# Patient Record
Sex: Female | Born: 1950 | Race: White | Hispanic: No | Smoking: Never smoker
Health system: Southern US, Community
[De-identification: ages and names within clinical notes are randomized; demographics above are authoritative.]

## PROBLEM LIST (undated history)

## (undated) DIAGNOSIS — M1990 Unspecified osteoarthritis, unspecified site: Secondary | ICD-10-CM

## (undated) DIAGNOSIS — L309 Dermatitis, unspecified: Secondary | ICD-10-CM

## (undated) DIAGNOSIS — R011 Cardiac murmur, unspecified: Secondary | ICD-10-CM

## (undated) DIAGNOSIS — K219 Gastro-esophageal reflux disease without esophagitis: Secondary | ICD-10-CM

## (undated) DIAGNOSIS — E7800 Pure hypercholesterolemia, unspecified: Secondary | ICD-10-CM

## (undated) DIAGNOSIS — F419 Anxiety disorder, unspecified: Secondary | ICD-10-CM

## (undated) DIAGNOSIS — I00 Rheumatic fever without heart involvement: Secondary | ICD-10-CM

## (undated) HISTORY — PX: DILATION AND CURETTAGE OF UTERUS: SHX78

## (undated) HISTORY — DX: Anxiety disorder, unspecified: F41.9

## (undated) HISTORY — PX: CATARACT EXTRACTION: SUR2

## (undated) HISTORY — PX: BLADDER REPAIR: SHX76

## (undated) HISTORY — DX: Dermatitis, unspecified: L30.9

---

## 2012-03-03 ENCOUNTER — Encounter (HOSPITAL_COMMUNITY): Payer: Self-pay | Admitting: Pharmacy Technician

## 2012-03-03 MED ORDER — SODIUM CHLORIDE 0.45 % IV SOLN: Freq: Once | INTRAVENOUS | Status: DC

## 2012-03-03 NOTE — H&P (Signed)
  NTS SOAP Note  Vital Signs:  Vitals as of: 01/01/2012: Systolic 127: Diastolic 71: Heart Rate 77: Temp 99.57F: Height 20ft 4in: Weight 142Lbs 0 Ounces: OFC 0in: Respiratory Rate 0: O2 Saturation 0: Pain Level 0: BMI 24  BMI : 24.37 kg/m2  Subjective: This 61 Years 72 Months old Female presents for of colon polyp.  Had a TCS in 2011 at another facility and told they removed a precancerous polyp.  They told her she needed follow up TCS in two years.  She also has some GERD and dysphagia.  Never has had an EGD.  Does have some bloating.  Review of Symptoms:  Constitutional:unremarkable Head:unremarkable Eyes:unremarkable sinus Cardiovascular:unremarkable Respiratory:unremarkable Gastrointestinabdominal pain,excessive gas Genitourinary:unremarkable Musculoskeletal:neck pain Skin:unremarkable Hematolgic/Lymphatic:unremarkable Allergic/Immunologic:unremarkable   Past Medical History:Reviewed   Past Medical History  Surgical History: TCS 2011, cataract surgery Medical Problems: unremarkable Allergies: nkda Medications: probiotics, cholestoff, calcium, glucosamine   Social History:Reviewed  Social History  Preferred Language: English ([place]) Race:  White Ethnicity: Not Hispanic / Latino Age: 61 Years 4 Months Marital Status:  W Alcohol:  No Recreational drug(s):  No   Smoking Status: Never smoker reviewed on 01/01/2012  Family History:Reviewed   Family History  Is there a family history of:No family h/o colon cancer             Father:  Cancer-prostate             Mother:  Coronary Artery Disease    Objective Information: General:Well appearing, well nourished in no distress. Heart:RRR, no murmur or gallop.  Normal S1, S2.  No S3, S4.  Lungs:CTA bilaterally, no wheezes, rhonchi, rales.  Breathing unlabored. Abdomen:Soft, NT/ND, no HSM, no masses. deferred to procedure  Assessment:h/o  colon polyps, dysphagia  Diagnosis &amp; Procedure: DiagnosisCode: V12.72, ProcedureCode: 81191, DiagnosisCode: 787.20, ProcedureCode: 47829,    Plan:Scheduled for TCS and EGD on 03/04/12.   Patient Education:Alternative treatments to surgery were discussed with patient (and family).Risks and benefits  of procedure were fully explained to the patient (and family) who gave informed consent. Patient/family questions were addressed.  Follow-up:Pending Surgery

## 2012-03-04 ENCOUNTER — Ambulatory Visit (HOSPITAL_COMMUNITY)
Admission: RE | Admit: 2012-03-04 | Discharge: 2012-03-04 | Disposition: A | Discharge status: Home / Self Care | Payer: BC Managed Care – PPO | Source: Ambulatory Visit | Attending: General Surgery | Admitting: General Surgery

## 2012-03-04 ENCOUNTER — Encounter (HOSPITAL_COMMUNITY)
Admission: RE | Disposition: A | Discharge status: Home / Self Care | Payer: Self-pay | Source: Ambulatory Visit | Attending: General Surgery

## 2012-03-04 ENCOUNTER — Encounter (HOSPITAL_COMMUNITY): Payer: Self-pay

## 2012-03-04 DIAGNOSIS — Z860100 Personal history of colon polyps, unspecified: Secondary | ICD-10-CM | POA: Insufficient documentation

## 2012-03-04 DIAGNOSIS — K219 Gastro-esophageal reflux disease without esophagitis: Secondary | ICD-10-CM | POA: Insufficient documentation

## 2012-03-04 DIAGNOSIS — Z09 Encounter for follow-up examination after completed treatment for conditions other than malignant neoplasm: Secondary | ICD-10-CM | POA: Insufficient documentation

## 2012-03-04 DIAGNOSIS — K449 Diaphragmatic hernia without obstruction or gangrene: Secondary | ICD-10-CM | POA: Insufficient documentation

## 2012-03-04 DIAGNOSIS — A048 Other specified bacterial intestinal infections: Secondary | ICD-10-CM | POA: Insufficient documentation

## 2012-03-04 HISTORY — PX: ESOPHAGOGASTRODUODENOSCOPY: SHX5428

## 2012-03-04 HISTORY — DX: Pure hypercholesterolemia, unspecified: E78.00

## 2012-03-04 HISTORY — PX: COLONOSCOPY: SHX5424

## 2012-03-04 HISTORY — DX: Unspecified osteoarthritis, unspecified site: M19.90

## 2012-03-04 HISTORY — DX: Cardiac murmur, unspecified: R01.1

## 2012-03-04 HISTORY — DX: Rheumatic fever without heart involvement: I00

## 2012-03-04 SURGERY — COLONOSCOPY
Anesthesia: Moderate Sedation

## 2012-03-04 MED ORDER — SODIUM CHLORIDE 0.9 % IV SOLN
1.0000 g | INTRAVENOUS | Status: DC
  Filled 2012-03-04: qty 1

## 2012-03-04 MED ORDER — SODIUM CHLORIDE 0.9 % IV SOLN
INTRAVENOUS | Status: AC
  Administered 2012-03-04: 1 g via INTRAVENOUS
  Filled 2012-03-04: qty 1

## 2012-03-04 MED ORDER — BUTAMBEN-TETRACAINE-BENZOCAINE 2-2-14 % EX AERO
INHALATION_SPRAY | CUTANEOUS | Status: DC | PRN
  Administered 2012-03-04: 2 via TOPICAL

## 2012-03-04 MED ORDER — MIDAZOLAM HCL 5 MG/5ML IJ SOLN
INTRAMUSCULAR | Status: DC | PRN
  Administered 2012-03-04: 1 mg via INTRAVENOUS
  Administered 2012-03-04: 3 mg via INTRAVENOUS

## 2012-03-04 MED ORDER — MEPERIDINE HCL 100 MG/ML IJ SOLN
INTRAMUSCULAR | Status: AC
  Filled 2012-03-04: qty 1

## 2012-03-04 MED ORDER — STERILE WATER FOR IRRIGATION IR SOLN
Status: DC | PRN
  Administered 2012-03-04: 08:00:00

## 2012-03-04 MED ORDER — MEPERIDINE HCL 25 MG/ML IJ SOLN
INTRAMUSCULAR | Status: DC | PRN
  Administered 2012-03-04: 50 mg via INTRAVENOUS

## 2012-03-04 MED ORDER — MIDAZOLAM HCL 5 MG/5ML IJ SOLN
INTRAMUSCULAR | Status: AC
  Filled 2012-03-04: qty 10

## 2012-03-04 NOTE — Interval H&P Note (Signed)
History and Physical Interval Note:  03/04/2012 8:15 AM  Cassie Gonzalez  has presented today for surgery, with the diagnosis of History of colon polyps   The various methods of treatment have been discussed with the patient and family. After consideration of risks, benefits and other options for treatment, the patient has consented to  Procedure(s) (LRB): COLONOSCOPY (N/A) ESOPHAGOGASTRODUODENOSCOPY (EGD) (N/A) as a surgical intervention .  The patient's history has been reviewed, patient examined, no change in status, stable for surgery.  I have reviewed the patients' chart and labs.  Questions were answered to the patient's satisfaction.     [name]

## 2012-03-04 NOTE — Op Note (Signed)
[facility] [address] [place], [place]  [address]  COLONOSCOPY PROCEDURE REPORT  PATIENT:  Cassie, Gonzalez  MR#:  [id number] BIRTHDATE:  Feb 18, 1951, 60 yrs. old  GENDER:  female ENDOSCOPIST:  Franky Macho, MD REF. BY: PROCEDURE DATE:  03/04/2012 PROCEDURE:  Colonoscopy 19147 ASA CLASS:  Class II INDICATIONS:  follow-up of polyp MEDICATIONS:   Versed 4 mg IV, demerol 50 mg IV  DESCRIPTION OF PROCEDURE:   After the risks benefits and alternatives of the procedure were thoroughly explained, informed consent was obtained.  Digital rectal exam was performed and revealed no abnormalities.   The EC-[id number] ([id number]) endoscope was introduced through the anus and advanced to the cecum, which was identified by both the appendix and ileocecal valve, without limitations.  The quality of the prep was adequate..  The instrument was then slowly withdrawn as the colon was fully examined.  FINDINGS:  A normal appearing cecum, ileocecal valve, and appendiceal orifice were identified. The ascending, hepatic flexure, transverse, splenic flexure, descending, sigmoid colon, and rectum appeared unremarkable.   Retroflexed views in the rectum revealed it was not tolerated by the patient.     The scope was then withdrawn from the cecum and the procedure completed. COMPLICATIONS:  None ENDOSCOPIC IMPRESSION: 1) Normal colon  RECOMMENDATIONS:  REPEAT EXAM:  In 5 year(s) for Colonoscopy.  ______________________________ Franky Macho, MD  CC:  n. eSIGNEDFranky Macho at 03/04/2012 08:43 AM  Royanne Foots, [phone]

## 2012-03-04 NOTE — Discharge Instructions (Signed)
Esophagogastroduodenoscopy This is an endoscopic procedure (a procedure that uses a device like a flexible telescope) that allows your caregiver to view the upper stomach and small bowel. This test allows your caregiver to look at the esophagus. The esophagus carries food from your mouth to your stomach. They can also look at your duodenum. This is the first part of the small intestine that attaches to the stomach. This test is used to detect problems in the bowel such as ulcers and inflammation. PREPARATION FOR TEST Nothing to eat after midnight the day before the test. NORMAL FINDINGS Normal esophagus, stomach, and duodenum. Ranges for normal findings may vary among different laboratories and hospitals. You should always check with your doctor after having lab work or other tests done to discuss the meaning of your test results and whether your values are considered within normal limits. MEANING OF TEST  Your caregiver will go over the test results with you and discuss the importance and meaning of your results, as well as treatment options and the need for additional tests if necessary. OBTAINING THE TEST RESULTS It is your responsibility to obtain your test results. Ask the lab or department performing the test when and how you will get your results. Document Released: 01/04/2005 Document Revised: 08/23/2011 Document Reviewed: 08/13/2008 ExitCare Patient Information 2012 [employer].Colonoscopy Care After Read the instructions outlined below and refer to this sheet in the next few weeks. These discharge instructions provide you with general information on caring for yourself after you leave the hospital. Your doctor may also give you specific instructions. While your treatment has been planned according to the most current medical practices available, unavoidable complications occasionally occur. If you have any problems or questions after discharge, call your doctor. HOME CARE  INSTRUCTIONS ACTIVITY:  You may resume your regular activity, but move at a slower pace for the next 24 hours.   Take frequent rest periods for the next 24 hours.   Walking will help get rid of the air and reduce the bloated feeling in your belly (abdomen).   No driving for 24 hours (because of the medicine (anesthesia) used during the test).   You may shower.   Do not sign any important legal documents or operate any machinery for 24 hours (because of the anesthesia used during the test).  NUTRITION:  Drink plenty of fluids.   You may resume your normal diet as instructed by your doctor.   Begin with a light meal and progress to your normal diet. Heavy or fried foods are harder to digest and may make you feel sick to your stomach (nauseated).   Avoid alcoholic beverages for 24 hours or as instructed.  MEDICATIONS:  You may resume your normal medications unless your doctor tells you otherwise.  WHAT TO EXPECT TODAY:  Some feelings of bloating in the abdomen.   Passage of more gas than usual.   Spotting of blood in your stool or on the toilet paper.  IF YOU HAD POLYPS REMOVED DURING THE COLONOSCOPY:  No aspirin products for 7 days or as instructed.   No alcohol for 7 days or as instructed.   Eat a soft diet for the next 24 hours.  FINDING OUT THE RESULTS OF YOUR TEST Not all test results are available during your visit. If your test results are not back during the visit, make an appointment with your caregiver to find out the results. Do not assume everything is normal if you have not heard from your caregiver   or the medical facility. It is important for you to follow up on all of your test results.  SEEK IMMEDIATE MEDICAL CARE IF:  You have more than a spotting of blood in your stool.   Your belly is swollen (abdominal distention).   You are nauseated or vomiting.   You have a fever.   You have abdominal pain or discomfort that is severe or gets worse throughout  the day.  Document Released: 04/17/2004 Document Revised: 08/23/2011 Document Reviewed: 04/15/2008 ExitCare Patient Information 2012 [employer]. 

## 2012-03-04 NOTE — Op Note (Signed)
[facility] [address] [place], [place]  [address]  ENDOSCOPY PROCEDURE REPORT  PATIENT:  Cassie Gonzalez, Cassie Gonzalez  MR#:  [id number] BIRTHDATE:  20-Jan-1951, 60 yrs. old  GENDER:  female  ENDOSCOPIST:  Franky Macho, MD Referred by:  PROCEDURE DATE:  03/04/2012 PROCEDURE:  EGD with biopsy for H. pylori 19147 ASA CLASS:  Class II INDICATIONS:  GERD  MEDICATIONS:   Versed 4 mg IV, demerol 50 mg IV TOPICAL ANESTHETIC:  Cetacaine Spray  DESCRIPTION OF PROCEDURE:   After the risks benefits and alternatives of the procedure were thoroughly explained, informed consent was obtained.  The [id number] ([id number]) and EC-[id number] ([id number]) endoscope was introduced through the mouth and advanced to the second portion of the duodenum, without limitations.  The instrument was slowly withdrawn as the mucosa was fully examined. <<PROCEDUREIMAGES>>  The duodenal bulb was normal in appearance, as was the postbulbar duodenum.  The stomach was entered and closely examined. The antrum, angularis, and lesser curvature were well visualized, including a retroflexed view of the cardia and fundus. The stomach wall was normally distensable. The scope passed easily through the pylorus into the duodenum.  A hiatal hernia was found (see image001 and image002).  Z-line at 34cm from the teeth.  GE juncture at 36cm from the teeth.  Esophagus otherwise within normal limits.  No eosphagitis noted.  The scope was then withdrawn from the patient and the procedure completed.  COMPLICATIONS:  None  ENDOSCOPIC IMPRESSION: 1) Normal duodenum 2) Normal stomach 3) Hiatal hernia  RECOMMENDATIONS: 1) follow-up of helicobacter pylori status, treat if indicated  REPEAT EXAM:  No  ______________________________ Franky Macho, MD  CC:  n. eSIGNEDFranky Macho at 03/04/2012 08:49 AM  Royanne Foots, [phone]

## 2012-03-05 ENCOUNTER — Encounter (HOSPITAL_COMMUNITY): Payer: Self-pay | Admitting: General Surgery

## 2016-11-29 ENCOUNTER — Ambulatory Visit: Payer: Self-pay | Admitting: Surgery

## 2016-11-29 NOTE — Patient Instructions (Signed)
Cassie Gonzalez  11/29/2016   Your procedure is scheduled on: 12/04/2016    [facility]  [place]tr[place]  elevators to 3rd floor to  Short Stay Center at   0915 AM.  Call this number if you have problems the morning of surgery [phone]   Remember: ONLY 1 PERSON MAY GO WITH YOU TO SHORT STAY TO GET  READY MORNING OF YOUR SURGERY.  Do not eat food or drink liquids :After Midnight.     Take these medicines the morning of surgery with A SIP OF WATER: Eye drops if needed, Prilosec                                 You may not have any metal on your body including hair pins and              piercings  Do not wear jewelry, make-up, lotions, powders or perfumes, deodorant             Do not wear nail polish.  Do not shave  48 hours prior to surgery.                 Do not bring valuables to the hospital. [facility] IS NOT             RESPONSIBLE   FOR VALUABLES.  Contacts, dentures or bridgework may not be worn into surgery.      Patients discharged the day of surgery will not be allowed to drive home.  Name and phone number of your driver:                Please read over the following fact sheets you were given: _____________________________________________________________________             [facility] - Pre[facility] Before surgery, you can play an important role.  Because skin is not sterile, your skin needs to be as free of germs as possible.  You can reduce the number of germs on your skin by washing with CHG (chlorahexidine gluconate) soap before surgery.  CHG is an antiseptic cleaner which kills germs and bonds with the skin to continue killing germs even after washing. Please DO NOT use if you have an allergy to CHG or antibacterial soaps.  If your skin becomes reddened/irritated stop using the CHG and inform your nurse when you arrive at Short Stay. Do not shave (including legs and underarms) for at least 48 hours prior to  the first CHG shower.  You may shave your face/neck. Please follow these instructions carefully:  1.  Shower with CHG Soap the night before surgery and the  morning of Surgery.  2.  If you choose to wash your hair, wash your hair first as usual with your  normal  shampoo.  3.  After you shampoo, rinse your hair and body thoroughly to remove the  shampoo.                           4.  Use CHG as you would any other liquid soap.  You can apply chg directly  to the skin and wash                       Gently  with a scrungie or clean washcloth.  5.  Apply the CHG Soap to your body ONLY FROM THE NECK DOWN.   Do not use on face/ open                           Wound or open sores. Avoid contact with eyes, ears mouth and genitals (private parts).                       Wash face,  Genitals (private parts) with your normal soap.             6.  Wash thoroughly, paying special attention to the area where your surgery  will be performed.  7.  Thoroughly rinse your body with warm water from the neck down.  8.  DO NOT shower/wash with your normal soap after using and rinsing off  the CHG Soap.                9.  Pat yourself dry with a clean towel.            10.  Wear clean pajamas.            11.  Place clean sheets on your bed the night of your first shower and do not  sleep with pets. Day of Surgery : Do not apply any lotions/deodorants the morning of surgery.  Please wear clean clothes to the hospital/surgery center.  FAILURE TO FOLLOW THESE INSTRUCTIONS MAY RESULT IN THE CANCELLATION OF YOUR SURGERY PATIENT SIGNATURE_________________________________  NURSE SIGNATURE__________________________________  ________________________________________________________________________

## 2016-11-29 NOTE — H&P (Signed)
Cassie Gonzalez 11/29/2016 10:57 AM Location: [facility] Patient #: [id number] DOB: 23-Aug-1951 Widowed / Language: [place] / Race: White Female  History of Present Illness (Zale Marcotte A. Fredricka Bonineonnor MD; 11/29/2016 11:26 AM) Patient words: This is a very nice 66 year old woman who is referred for biliary dyskinesia. She has been having symptoms for at least 2 months. She experiences epigastric discomfort which radiates around both sides into her back. The pain is similar to gas pain. It is exacerbated by eating. She does not have any nausea or emesis, but she does have a history of GERD which is well controlled with medication. She has a known small hiatal hernia. She also has a history of chronic constipation followed by GI. She underwent upper endoscopy, colonoscopy, EKG, echocardiogram, treadmill stress test, right upper quadrant ultrasound as part of her workup-all of these were unremarkable. She had labs done in December which as she recalls were normal. She finally underwent a HIDA scan last week that showed a gallbladder ejection fraction of 20%.  She is never had any abdominal surgery. She does have a history of heart murmur and rheumatic fever as a child but again her cardiac workup as above was normal.  The patient is a 66 year old female.   Past Surgical History Juanita Craver(Armen Glenn, CMA; 11/29/2016 10:57 AM) Cataract Surgery Bilateral. Colon Polyp Removal - Colonoscopy  Diagnostic Studies History Juanita Craver(Armen Glenn, CMA; 11/29/2016 10:57 AM) Colonoscopy within last year Mammogram within last year  Allergies Juanita Craver(Armen Glenn, CMA; 11/29/2016 10:59 AM) Sulfa Antibiotics  Medication History (Armen Sherrine MaplesGlenn, CMA; 11/29/2016 11:04 AM) Omeprazole (20MG  Capsule DR, Oral) Active. Simvastatin (10MG  Tablet, Oral) Active. Latanoprost (0.005% Solution, Ophthalmic) Active. Vitamin B12 (1000MCG Tablet ER, Oral) Active. Calcium (1200-1000MG -UNIT Tablet Chewable, Oral) Active. Vitamin D3  (1000UNIT Tablet, Oral) Active. Vitamin C (500MG  Tablet, Oral) Active. Zantac (150MG  Tablet, Oral) Active. Gas-X (80MG  Tablet Chewable, Oral) Active. Align (4MG  Capsule, Oral) Active. MiraLax (Oral) Active. Estrace (1MG  Tablet, Oral) Active. Fluzone High-Dose (0.5ML Susp Pref Syr, Intramuscular) Active. Medications Reconciled  Social History Juanita Craver(Armen Glenn, CMA; 11/29/2016 10:57 AM) Caffeine use Coffee, Tea. No alcohol use Tobacco use Never smoker.  Family History Juanita Craver(Armen Glenn, CMA; 11/29/2016 10:57 AM) Arthritis Mother. Breast Cancer Family Members In General. Colon Cancer Family Members In General. Heart Disease Mother. Heart disease in female family member before age 265 Prostate Cancer Father.  Pregnancy / Birth History Juanita Craver(Armen Glenn, CMA; 11/29/2016 10:57 AM) Age at menarche 9 years. Age of menopause 46>60 Gravida 2 Maternal age 38-25  Other Problems Juanita Craver(Armen Glenn, CMA; 11/29/2016 10:57 AM) Arthritis Bladder Problems Gastroesophageal Reflux Disease Heart murmur     Review of Systems (Armen Glenn CMA; 11/29/2016 10:57 AM) General Present- Appetite Loss, Fatigue, Night Sweats and Weight Loss. Not Present- Chills, Fever and Weight Gain. Skin Not Present- Change in Wart/Mole, Dryness, Hives, Jaundice, New Lesions, Non-Healing Wounds, Rash and Ulcer. HEENT Present- Ringing in the Ears, Seasonal Allergies, Sinus Pain and Wears glasses/contact lenses. Not Present- Earache, Hearing Loss, Hoarseness, Nose Bleed, Oral Ulcers, Sore Throat, Visual Disturbances and Yellow Eyes. Respiratory Present- Snoring. Not Present- Bloody sputum, Chronic Cough, Difficulty Breathing and Wheezing. Breast Not Present- Breast Mass, Breast Pain, Nipple Discharge and Skin Changes. Gastrointestinal Present- Bloating, Constipation and Excessive gas. Not Present- Abdominal Pain, Bloody Stool, Change in Bowel Habits, Chronic diarrhea, Difficulty Swallowing, Gets full quickly at meals,  Hemorrhoids, Indigestion, Nausea, Rectal Pain and Vomiting. Female Genitourinary Present- Nocturia. Not Present- Frequency, Painful Urination, Pelvic Pain and Urgency. Musculoskeletal Present- Back Pain  and Joint Stiffness. Not Present- Joint Pain, Muscle Pain, Muscle Weakness and Swelling of Extremities. Neurological Present- Weakness. Not Present- Decreased Memory, Fainting, Headaches, Numbness, Seizures, Tingling, Tremor and Trouble walking. Psychiatric Not Present- Anxiety, Bipolar, Change in Sleep Pattern, Depression, Fearful and Frequent crying. Endocrine Present- Hot flashes. Not Present- Cold Intolerance, Excessive Hunger, Hair Changes, Heat Intolerance and New Diabetes. Hematology Present- Easy Bruising. Not Present- Blood Thinners, Excessive bleeding, Gland problems, HIV and Persistent Infections.  Vitals (Armen Glenn CMA; 11/29/2016 10:58 AM) 11/29/2016 10:57 AM Weight: 127 lb Height: 64in Body Surface Area: 1.61 m Body Mass Index: 21.8 kg/m  Temp.: 97.54F  Pulse: 64 (Regular)  BP: 128/68 (Sitting, Left Arm, Standard)      Physical Exam (Falicity Sheets A. Fredricka Bonine MD; 11/29/2016 11:27 AM)  General Note: She is alert and oriented, no distress  Integumentary Note: No lesions or rashes on limited skin exam  Head and Neck Note: No mass or thyromegaly  Eye Note: Anicteric, extraocular motion intact  ENMT Note: Moist mucous membranes, dentures in place  Chest and Lung Exam Note: Unlabored respirations, symmetrical air entry  Cardiovascular Note: Regular rate and rhythm, no pedal edema  Abdomen Note: Soft, nontender nondistended. No mass or organomegaly  Neurologic Note: Grossly intact, normal gait  Neuropsychiatric Note: Normal mood and affect, appropriate insight  Musculoskeletal Note: Strength symmetrical throughout, no deformity    Assessment & Plan (Bartt Gonzaga A. Bathsheba Durrett MD; 11/29/2016 11:29 AM)  BILIARY DYSKINESIA (K82.8) Story: Symptomatic  with pain and about a 10 pound weight loss since January. She is interested in gallbladder surgery. We discussed the nature laparoscopic gallbladder surgery, the expected postoperative recovery, and the risks of bleeding, pain, infection, scarring, common bile duct injury or other ventral abdominal injury and sequelae, conversion to open surgery. She had several appropriate questions all of which were answered. At the end of our conversation she desries lap chole. We will get her scheduled for laparoscopic cholecystectomy as soon as possible.

## 2016-11-30 ENCOUNTER — Encounter (HOSPITAL_COMMUNITY)
Admission: RE | Admit: 2016-11-30 | Discharge: 2016-11-30 | Disposition: A | Discharge status: Home / Self Care | Payer: Medicare Other | Source: Ambulatory Visit | Attending: Surgery | Admitting: Surgery

## 2016-11-30 ENCOUNTER — Ambulatory Visit (HOSPITAL_COMMUNITY)
Admission: RE | Admit: 2016-11-30 | Discharge: 2016-11-30 | Disposition: A | Discharge status: Home / Self Care | Payer: Medicare Other | Source: Ambulatory Visit | Attending: Surgery | Admitting: Surgery

## 2016-11-30 ENCOUNTER — Encounter (INDEPENDENT_AMBULATORY_CARE_PROVIDER_SITE_OTHER): Payer: Self-pay

## 2016-11-30 ENCOUNTER — Encounter (HOSPITAL_COMMUNITY): Payer: Self-pay

## 2016-11-30 DIAGNOSIS — K828 Other specified diseases of gallbladder: Secondary | ICD-10-CM | POA: Insufficient documentation

## 2016-11-30 DIAGNOSIS — Z01812 Encounter for preprocedural laboratory examination: Secondary | ICD-10-CM | POA: Insufficient documentation

## 2016-11-30 DIAGNOSIS — Z01810 Encounter for preprocedural cardiovascular examination: Secondary | ICD-10-CM

## 2016-11-30 DIAGNOSIS — Z01818 Encounter for other preprocedural examination: Secondary | ICD-10-CM | POA: Insufficient documentation

## 2016-11-30 LAB — CBC
HCT: 38 % (ref 36.0–46.0)
Hemoglobin: 12.9 g/dL (ref 12.0–15.0)
MCH: 29.2 pg (ref 26.0–34.0)
MCHC: 33.9 g/dL (ref 30.0–36.0)
MCV: 86 fL (ref 78.0–100.0)
PLATELETS: 260 10*3/uL (ref 150–400)
RBC: 4.42 MIL/uL (ref 3.87–5.11)
RDW: 13.2 % (ref 11.5–15.5)
WBC: 6.1 10*3/uL (ref 4.0–10.5)

## 2016-11-30 LAB — COMPREHENSIVE METABOLIC PANEL
ALT: 15 U/L (ref 14–54)
AST: 22 U/L (ref 15–41)
Albumin: 4.4 g/dL (ref 3.5–5.0)
Alkaline Phosphatase: 60 U/L (ref 38–126)
Anion gap: 6 (ref 5–15)
BUN: 17 mg/dL (ref 6–20)
CHLORIDE: 109 mmol/L (ref 101–111)
CO2: 26 mmol/L (ref 22–32)
CREATININE: 0.8 mg/dL (ref 0.44–1.00)
Calcium: 9.9 mg/dL (ref 8.9–10.3)
Glucose, Bld: 94 mg/dL (ref 65–99)
POTASSIUM: 3.9 mmol/L (ref 3.5–5.1)
Sodium: 141 mmol/L (ref 135–145)
Total Bilirubin: 0.7 mg/dL (ref 0.3–1.2)
Total Protein: 7.4 g/dL (ref 6.5–8.1)

## 2016-11-30 LAB — APTT: APTT: 33 s (ref 24–36)

## 2016-11-30 LAB — PROTIME-INR
INR: 1.04
PROTHROMBIN TIME: 13.6 s (ref 11.4–15.2)

## 2016-11-30 NOTE — Progress Notes (Signed)
LOV cardio Dr Sherlie Banruitte 10-05-16 on chart EKG 10-05-16 on chart  Stress test 10-26-16 on chart ECHO 10-26-16 on hcart

## 2016-12-04 ENCOUNTER — Ambulatory Visit (HOSPITAL_COMMUNITY): Payer: Medicare Other | Admitting: Anesthesiology

## 2016-12-04 ENCOUNTER — Ambulatory Visit (HOSPITAL_COMMUNITY)
Admission: RE | Admit: 2016-12-04 | Discharge: 2016-12-04 | Disposition: A | Discharge status: Home / Self Care | Payer: Medicare Other | Source: Ambulatory Visit | Attending: Surgery | Admitting: Surgery

## 2016-12-04 ENCOUNTER — Encounter (HOSPITAL_COMMUNITY)
Admission: RE | Disposition: A | Discharge status: Home / Self Care | Payer: Self-pay | Source: Ambulatory Visit | Attending: Surgery

## 2016-12-04 ENCOUNTER — Encounter (HOSPITAL_COMMUNITY): Payer: Self-pay

## 2016-12-04 DIAGNOSIS — K811 Chronic cholecystitis: Secondary | ICD-10-CM | POA: Insufficient documentation

## 2016-12-04 DIAGNOSIS — K219 Gastro-esophageal reflux disease without esophagitis: Secondary | ICD-10-CM | POA: Diagnosis not present

## 2016-12-04 DIAGNOSIS — Z8042 Family history of malignant neoplasm of prostate: Secondary | ICD-10-CM | POA: Insufficient documentation

## 2016-12-04 DIAGNOSIS — Z9841 Cataract extraction status, right eye: Secondary | ICD-10-CM | POA: Insufficient documentation

## 2016-12-04 DIAGNOSIS — Z79890 Hormone replacement therapy (postmenopausal): Secondary | ICD-10-CM | POA: Diagnosis not present

## 2016-12-04 DIAGNOSIS — M1990 Unspecified osteoarthritis, unspecified site: Secondary | ICD-10-CM | POA: Diagnosis not present

## 2016-12-04 DIAGNOSIS — Z8249 Family history of ischemic heart disease and other diseases of the circulatory system: Secondary | ICD-10-CM | POA: Diagnosis not present

## 2016-12-04 DIAGNOSIS — Z803 Family history of malignant neoplasm of breast: Secondary | ICD-10-CM | POA: Insufficient documentation

## 2016-12-04 DIAGNOSIS — Z79899 Other long term (current) drug therapy: Secondary | ICD-10-CM | POA: Diagnosis not present

## 2016-12-04 DIAGNOSIS — Z800 Family history of malignant neoplasm of digestive organs: Secondary | ICD-10-CM | POA: Insufficient documentation

## 2016-12-04 DIAGNOSIS — Z9842 Cataract extraction status, left eye: Secondary | ICD-10-CM | POA: Insufficient documentation

## 2016-12-04 DIAGNOSIS — K828 Other specified diseases of gallbladder: Secondary | ICD-10-CM | POA: Diagnosis present

## 2016-12-04 DIAGNOSIS — Z882 Allergy status to sulfonamides status: Secondary | ICD-10-CM | POA: Insufficient documentation

## 2016-12-04 DIAGNOSIS — Z86010 Personal history of colonic polyps: Secondary | ICD-10-CM | POA: Insufficient documentation

## 2016-12-04 HISTORY — PX: CHOLECYSTECTOMY: SHX55

## 2016-12-04 SURGERY — LAPAROSCOPIC CHOLECYSTECTOMY
Anesthesia: General | Site: Abdomen

## 2016-12-04 MED ORDER — 0.9 % SODIUM CHLORIDE (POUR BTL) OPTIME
TOPICAL | Status: DC | PRN
  Administered 2016-12-04: 1000 mL

## 2016-12-04 MED ORDER — ONDANSETRON HCL 4 MG/2ML IJ SOLN
INTRAMUSCULAR | Status: DC | PRN
  Administered 2016-12-04: 4 mg via INTRAVENOUS

## 2016-12-04 MED ORDER — FENTANYL CITRATE (PF) 100 MCG/2ML IJ SOLN
INTRAMUSCULAR | Status: AC
  Filled 2016-12-04: qty 2

## 2016-12-04 MED ORDER — LACTATED RINGERS IR SOLN
Status: DC | PRN
  Administered 2016-12-04: 1000 mL

## 2016-12-04 MED ORDER — FENTANYL CITRATE (PF) 100 MCG/2ML IJ SOLN: 25.0000 ug | INTRAMUSCULAR | Status: DC | PRN

## 2016-12-04 MED ORDER — HYDROCODONE-ACETAMINOPHEN 5-325 MG PO TABS: 1.0000 | ORAL_TABLET | Freq: Four times a day (QID) | ORAL | 0 refills | Status: DC | PRN

## 2016-12-04 MED ORDER — LIDOCAINE 2% (20 MG/ML) 5 ML SYRINGE
INTRAMUSCULAR | Status: AC
  Filled 2016-12-04: qty 5

## 2016-12-04 MED ORDER — DEXAMETHASONE SODIUM PHOSPHATE 10 MG/ML IJ SOLN
INTRAMUSCULAR | Status: DC | PRN
  Administered 2016-12-04: 10 mg via INTRAVENOUS

## 2016-12-04 MED ORDER — BUPIVACAINE-EPINEPHRINE 0.25% -1:200000 IJ SOLN
INTRAMUSCULAR | Status: AC
  Filled 2016-12-04: qty 1

## 2016-12-04 MED ORDER — SUCCINYLCHOLINE CHLORIDE 200 MG/10ML IV SOSY
PREFILLED_SYRINGE | INTRAVENOUS | Status: DC | PRN
  Administered 2016-12-04: 100 mg via INTRAVENOUS

## 2016-12-04 MED ORDER — SUGAMMADEX SODIUM 200 MG/2ML IV SOLN
INTRAVENOUS | Status: DC | PRN
  Administered 2016-12-04: 150 mg via INTRAVENOUS

## 2016-12-04 MED ORDER — GABAPENTIN 300 MG PO CAPS
300.0000 mg | ORAL_CAPSULE | ORAL | Status: AC
  Administered 2016-12-04: 300 mg via ORAL
  Filled 2016-12-04: qty 1

## 2016-12-04 MED ORDER — HYDROMORPHONE HCL 1 MG/ML IJ SOLN
0.2500 mg | INTRAMUSCULAR | Status: DC | PRN
  Administered 2016-12-04 (×2): 0.25 mg via INTRAVENOUS

## 2016-12-04 MED ORDER — ACETAMINOPHEN 325 MG PO TABS: 650.0000 mg | ORAL_TABLET | ORAL | Status: DC | PRN

## 2016-12-04 MED ORDER — HYDROMORPHONE HCL 2 MG/ML IJ SOLN
INTRAMUSCULAR | Status: AC
  Filled 2016-12-04: qty 1

## 2016-12-04 MED ORDER — LACTATED RINGERS IV SOLN
INTRAVENOUS | Status: DC
  Administered 2016-12-04 (×2): via INTRAVENOUS

## 2016-12-04 MED ORDER — ACETAMINOPHEN 650 MG RE SUPP
650.0000 mg | RECTAL | Status: DC | PRN
  Filled 2016-12-04: qty 1

## 2016-12-04 MED ORDER — MIDAZOLAM HCL 5 MG/5ML IJ SOLN
INTRAMUSCULAR | Status: DC | PRN
  Administered 2016-12-04: 2 mg via INTRAVENOUS

## 2016-12-04 MED ORDER — MEPERIDINE HCL 50 MG/ML IJ SOLN: 6.2500 mg | INTRAMUSCULAR | Status: DC | PRN

## 2016-12-04 MED ORDER — CEFAZOLIN SODIUM-DEXTROSE 2-4 GM/100ML-% IV SOLN
2.0000 g | INTRAVENOUS | Status: AC
  Administered 2016-12-04: 2 g via INTRAVENOUS
  Filled 2016-12-04: qty 100

## 2016-12-04 MED ORDER — PROMETHAZINE HCL 25 MG/ML IJ SOLN: 6.2500 mg | INTRAMUSCULAR | Status: DC | PRN

## 2016-12-04 MED ORDER — SODIUM CHLORIDE 0.9% FLUSH: 3.0000 mL | INTRAVENOUS | Status: DC | PRN

## 2016-12-04 MED ORDER — SODIUM CHLORIDE 0.9 % IV SOLN: 250.0000 mL | INTRAVENOUS | Status: DC | PRN

## 2016-12-04 MED ORDER — SODIUM CHLORIDE 0.9% FLUSH: 3.0000 mL | Freq: Two times a day (BID) | INTRAVENOUS | Status: DC

## 2016-12-04 MED ORDER — CHLORHEXIDINE GLUCONATE 4 % EX LIQD: 60.0000 mL | Freq: Once | CUTANEOUS | Status: DC

## 2016-12-04 MED ORDER — SUGAMMADEX SODIUM 200 MG/2ML IV SOLN
INTRAVENOUS | Status: AC
  Filled 2016-12-04: qty 2

## 2016-12-04 MED ORDER — FENTANYL CITRATE (PF) 100 MCG/2ML IJ SOLN
INTRAMUSCULAR | Status: DC | PRN
  Administered 2016-12-04 (×2): 50 ug via INTRAVENOUS

## 2016-12-04 MED ORDER — DEXAMETHASONE SODIUM PHOSPHATE 10 MG/ML IJ SOLN
INTRAMUSCULAR | Status: AC
  Filled 2016-12-04: qty 1

## 2016-12-04 MED ORDER — CELECOXIB 200 MG PO CAPS: 400.0000 mg | ORAL_CAPSULE | ORAL | Status: DC

## 2016-12-04 MED ORDER — OXYCODONE HCL 5 MG PO TABS
5.0000 mg | ORAL_TABLET | ORAL | Status: DC | PRN
  Administered 2016-12-04: 5 mg via ORAL
  Filled 2016-12-04: qty 1

## 2016-12-04 MED ORDER — ROCURONIUM BROMIDE 10 MG/ML (PF) SYRINGE
PREFILLED_SYRINGE | INTRAVENOUS | Status: DC | PRN
  Administered 2016-12-04: 30 mg via INTRAVENOUS

## 2016-12-04 MED ORDER — ACETAMINOPHEN 500 MG PO TABS
1000.0000 mg | ORAL_TABLET | ORAL | Status: AC
  Administered 2016-12-04: 1000 mg via ORAL
  Filled 2016-12-04: qty 2

## 2016-12-04 MED ORDER — BUPIVACAINE-EPINEPHRINE 0.25% -1:200000 IJ SOLN
INTRAMUSCULAR | Status: DC | PRN
  Administered 2016-12-04: 29 mL

## 2016-12-04 MED ORDER — ROCURONIUM BROMIDE 50 MG/5ML IV SOSY
PREFILLED_SYRINGE | INTRAVENOUS | Status: AC
  Filled 2016-12-04: qty 5

## 2016-12-04 MED ORDER — LIDOCAINE 2% (20 MG/ML) 5 ML SYRINGE
INTRAMUSCULAR | Status: DC | PRN
  Administered 2016-12-04: 100 mg via INTRAVENOUS

## 2016-12-04 MED ORDER — SUCCINYLCHOLINE CHLORIDE 200 MG/10ML IV SOSY
PREFILLED_SYRINGE | INTRAVENOUS | Status: AC
  Filled 2016-12-04: qty 10

## 2016-12-04 MED ORDER — LACTATED RINGERS IV SOLN: INTRAVENOUS | Status: DC

## 2016-12-04 MED ORDER — ONDANSETRON HCL 4 MG/2ML IJ SOLN
INTRAMUSCULAR | Status: AC
Start: 2016-12-04 — End: ?
  Filled 2016-12-04: qty 2

## 2016-12-04 MED ORDER — MIDAZOLAM HCL 2 MG/2ML IJ SOLN
INTRAMUSCULAR | Status: AC
  Filled 2016-12-04: qty 2

## 2016-12-04 MED ORDER — PROPOFOL 10 MG/ML IV BOLUS
INTRAVENOUS | Status: AC
  Filled 2016-12-04: qty 20

## 2016-12-04 MED ORDER — DOCUSATE SODIUM 100 MG PO CAPS: 100.0000 mg | ORAL_CAPSULE | Freq: Two times a day (BID) | ORAL | 0 refills | Status: AC

## 2016-12-04 MED ORDER — PROPOFOL 10 MG/ML IV BOLUS
INTRAVENOUS | Status: DC | PRN
  Administered 2016-12-04: 140 mg via INTRAVENOUS

## 2016-12-04 SURGICAL SUPPLY — 34 items
APPLIER CLIP ROT 10 11.4 M/L (STAPLE) ×3
CABLE HIGH FREQUENCY MONO STRZ (ELECTRODE) ×3 IMPLANT
CHLORAPREP W/TINT 26ML (MISCELLANEOUS) ×3 IMPLANT
CLIP APPLIE ROT 10 11.4 M/L (STAPLE) ×1 IMPLANT
COVER MAYO STAND STRL (DRAPES) IMPLANT
COVER SURGICAL LIGHT HANDLE (MISCELLANEOUS) ×3 IMPLANT
DECANTER SPIKE VIAL GLASS SM (MISCELLANEOUS) ×3 IMPLANT
DERMABOND ADVANCED (GAUZE/BANDAGES/DRESSINGS) ×2
DERMABOND ADVANCED .7 DNX12 (GAUZE/BANDAGES/DRESSINGS) ×1 IMPLANT
DEVICE PMI PUNCTURE CLOSURE (MISCELLANEOUS) ×3 IMPLANT
DRAPE C-ARM 42X120 X-RAY (DRAPES) IMPLANT
ELECT REM PT RETURN 9FT ADLT (ELECTROSURGICAL) ×3
ELECTRODE REM PT RTRN 9FT ADLT (ELECTROSURGICAL) ×1 IMPLANT
GLOVE BIO SURGEON STRL SZ 6 (GLOVE) ×3 IMPLANT
GLOVE INDICATOR 6.5 STRL GRN (GLOVE) ×3 IMPLANT
GOWN STRL REUS W/TWL LRG LVL3 (GOWN DISPOSABLE) ×3 IMPLANT
GOWN STRL REUS W/TWL XL LVL3 (GOWN DISPOSABLE) ×6 IMPLANT
HEMOSTAT SNOW SURGICEL 2X4 (HEMOSTASIS) IMPLANT
IRRIG SUCT STRYKERFLOW 2 WTIP (MISCELLANEOUS)
IRRIGATION SUCT STRKRFLW 2 WTP (MISCELLANEOUS) IMPLANT
KIT BASIN OR (CUSTOM PROCEDURE TRAY) ×3 IMPLANT
NEEDLE INSUFFLATION 14GA 120MM (NEEDLE) ×3 IMPLANT
POUCH SPECIMEN RETRIEVAL 10MM (ENDOMECHANICALS) ×3 IMPLANT
SCISSORS LAP 5X35 DISP (ENDOMECHANICALS) ×3 IMPLANT
SET CHOLANGIOGRAPH MIX (MISCELLANEOUS) IMPLANT
SET IRRIG TUBING LAPAROSCOPIC (IRRIGATION / IRRIGATOR) ×3 IMPLANT
SLEEVE XCEL OPT CAN 5 100 (ENDOMECHANICALS) ×6 IMPLANT
SUT MNCRL AB 4-0 PS2 18 (SUTURE) ×3 IMPLANT
TOWEL OR 17X26 10 PK STRL BLUE (TOWEL DISPOSABLE) ×3 IMPLANT
TOWEL OR NON WOVEN STRL DISP B (DISPOSABLE) IMPLANT
TRAY LAPAROSCOPIC (CUSTOM PROCEDURE TRAY) ×3 IMPLANT
TROCAR BLADELESS OPT 5 100 (ENDOMECHANICALS) ×3 IMPLANT
TROCAR XCEL 12X100 BLDLESS (ENDOMECHANICALS) ×3 IMPLANT
TUBING INSUF HEATED (TUBING) ×3 IMPLANT

## 2016-12-04 NOTE — Anesthesia Preprocedure Evaluation (Addendum)
Anesthesia Evaluation  Patient identified by MRN, date of birth, ID band Patient awake    Reviewed: Allergy & Precautions, NPO status , Patient's Chart, lab work & pertinent test results  Airway Mallampati: I  TM Distance: >3 FB Neck ROM: Full    Dental  (+) Teeth Intact, Dental Advisory Given   Pulmonary neg pulmonary ROS,    breath sounds clear to auscultation       Cardiovascular negative cardio ROS   Rhythm:Regular Rate:Normal     Neuro/Psych negative neurological ROS  negative psych ROS   GI/Hepatic Neg liver ROS, GERD  Medicated and Controlled,  Endo/Other  negative endocrine ROS  Renal/GU negative Renal ROS  negative genitourinary   Musculoskeletal  (+) Arthritis ,   Abdominal   Peds negative pediatric ROS (+)  Hematology negative hematology ROS (+)   Anesthesia Other Findings - HLD  Reproductive/Obstetrics negative OB ROS                            Anesthesia Physical Anesthesia Plan  ASA: II  Anesthesia Plan: General   Post-op Pain Management:    Induction: Intravenous  Airway Management Planned: Oral ETT  Additional Equipment:   Intra-op Plan:   Post-operative Plan: Extubation in OR  Informed Consent: I have reviewed the patients History and Physical, chart, labs and discussed the procedure including the risks, benefits and alternatives for the proposed anesthesia with the patient or authorized representative who has indicated his/her understanding and acceptance.   Dental advisory given  Plan Discussed with: CRNA  Anesthesia Plan Comments:         Anesthesia Quick Evaluation

## 2016-12-04 NOTE — Op Note (Signed)
Operative Note  Cassie Pradereresa S Gonzalez 66 y.o. female [id number]  12/04/2016  Surgeon: Berna Buehelsea A Lewis Grivas   Assistant: OR staff  Procedure performed: Laparoscopic Cholecystectomy  Preop diagnosis: biliary dyskinesia Post-op diagnosis/intraop findings: same  Specimens: gallbladder  EBL: <10cc  Complications: none  Description of procedure: After obtaining informed consent the patient was brought to the operating room. Prophylactic antibiotics and subcutaneous heparin were administered. SCD's were applied. General endotracheal anesthesia was initiated and a formal time-out was performed. The abdomen was prepped and draped in the usual sterile fashion and the abdomen was entered using an infraumbilical veress needle after instilling the site with local. Insufflation to 15mmHg was obtained, 5mm trocar and camera introduced, and gross inspection revealed no evidence of injury from our entry or other intraabdominal abnormalities. Two 5mm trocars were introduced in the right midclavicular and right anterior axillary lines under direct visualization and following infiltration with local. An 11mm trocar was placed in the epigastrium. The gallbladder was retracted cephalad and the infundibulum was retracted laterally. There were notable omental adhesions to the gallbladder and liver which were carefully taken down with cautery and blunt dissection. A combination of hook electrocautery and blunt dissection was utilized to clear the peritoneum from the neck and cystic duct, circumferentially isolating the cystic artery and cystic duct and lifting the gallbladder from the cystic plate. The critical view of safety was achieved with the cystic artery, cystic duct, and liver bed visualized between them with no other structures. The artery was clipped with a single clip proximally and distally and divided as was the cystic duct with two clips on the proximal end. The gallbladder was dissected from the liver plate using  electrocautery. Once freed the gallbladder was placed in an endocatch bag and removed through the epigastric trocar site.  Some bile had been spilled from the gallbladder during its dissection from the liver bed. This was aspirated and the right upper quadrant was irrigated copiously until the effluent was clear. Hemostasis was once again confirmed, and reinspection of the abdomen revealed no injuries. The clips were well opposed without any bleeding or bile leak from the duct or the liver bed. The 11mm trocar site in the epigastrium was closed with a 0 vicryl in the fascia under direct visualization using a PMI device. The abdomen was desufflated and all trocars removed. The skin incisions were closed with running subcuticular monocryl and Dermabond. The patient was awakened, extubated and transported to the recovery room in stable condition.   All counts were correct at the completion of the case.

## 2016-12-04 NOTE — Anesthesia Postprocedure Evaluation (Addendum)
Anesthesia Post Note  Patient: Cassie Gonzalez  Procedure(s) Performed: Procedure(s) (LRB): LAPAROSCOPIC CHOLECYSTECTOMY (N/A)  Patient location during evaluation: PACU Anesthesia Type: General Level of consciousness: awake and alert Pain management: pain level controlled Vital Signs Assessment: post-procedure vital signs reviewed and stable Respiratory status: spontaneous breathing, nonlabored ventilation, respiratory function stable and patient connected to nasal cannula oxygen Cardiovascular status: blood pressure returned to baseline and stable Postop Assessment: no signs of nausea or vomiting Anesthetic complications: no       Last Vitals:  Vitals:   12/04/16 1406 12/04/16 1510  BP: (!) 132/55 (!) 129/53  Pulse: 73 75  Resp: 16 16  Temp: 36.5 C     Last Pain:  Vitals:   12/04/16 1510  TempSrc:   PainSc: 1                  Shelton SilvasKevin D Esra Frankowski

## 2016-12-04 NOTE — Anesthesia Procedure Notes (Signed)
Procedure Name: Intubation Date/Time: 12/04/2016 11:52 AM Performed by: Lind Covert Pre-anesthesia Checklist: Patient identified, Emergency Drugs available, Suction available, Patient being monitored and Timeout performed Patient Re-evaluated:Patient Re-evaluated prior to inductionOxygen Delivery Method: Circle system utilized Preoxygenation: Pre-oxygenation with 100% oxygen Intubation Type: IV induction Laryngoscope Size: Mac and 3 Grade View: Grade I Tube type: Oral Tube size: 7.0 mm Number of attempts: 1 Airway Equipment and Method: Stylet Placement Confirmation: ETT inserted through vocal cords under direct vision,  positive ETCO2 and breath sounds checked- equal and bilateral Secured at: 21 cm Tube secured with: Tape Dental Injury: Teeth and Oropharynx as per pre-operative assessment

## 2016-12-04 NOTE — Transfer of Care (Signed)
Immediate Anesthesia Transfer of Care Note  Patient: Cassie Gonzalez  Procedure(s) Performed: Procedure(s): LAPAROSCOPIC CHOLECYSTECTOMY (N/A)  Patient Location: PACU  Anesthesia Type:General  Level of Consciousness: sedated  Airway & Oxygen Therapy: Patient Spontanous Breathing and Patient connected to face mask oxygen  Post-op Assessment: Report given to RN and Post -op Vital signs reviewed and stable  Post vital signs: Reviewed and stable  Last Vitals:  Vitals:   12/04/16 0915  BP: (!) 124/51  Pulse: (!) 52  Resp: 16  Temp: 36.3 C    Last Pain:  Vitals:   12/04/16 0915  TempSrc: Oral      Patients Stated Pain Goal: 3 (12/04/16 0944)  Complications: No apparent anesthesia complications

## 2016-12-04 NOTE — Discharge Instructions (Signed)
General Anesthesia, Adult, Care After These instructions provide you with information about caring for yourself after your procedure. Your health care provider may also give you more specific instructions. Your treatment has been planned according to current medical practices, but problems sometimes occur. Call your health care provider if you have any problems or questions after your procedure. What can I expect after the procedure? After the procedure, it is common to have:  Vomiting.  A sore throat.  Mental slowness. It is common to feel:  Nauseous.  Cold or shivery.  Sleepy.  Tired.  Sore or achy, even in parts of your body where you did not have surgery. Follow these instructions at home: For at least 24 hours after the procedure:   Do not:  Participate in activities where you could fall or become injured.  Drive.  Use heavy machinery.  Drink alcohol.  Take sleeping pills or medicines that cause drowsiness.  Make important decisions or sign legal documents.  Take care of children on your own.  Rest. Eating and drinking   If you vomit, drink water, juice, or soup when you can drink without vomiting.  Drink enough fluid to keep your urine clear or pale yellow.  Make sure you have little or no nausea before eating solid foods.  Follow the diet recommended by your health care provider. General instructions   Have a responsible adult stay with you until you are awake and alert.  Return to your normal activities as told by your health care provider. Ask your health care provider what activities are safe for you.  Take over-the-counter and prescription medicines only as told by your health care provider.  If you smoke, do not smoke without supervision.  Keep all follow-up visits as told by your health care provider. This is important. Contact a health care provider if:  You continue to have nausea or vomiting at home, and medicines are not helpful.  You  cannot drink fluids or start eating again.  You cannot urinate after 8-12 hours.  You develop a skin rash.  You have fever.  You have increasing redness at the site of your procedure. Get help right away if:  You have difficulty breathing.  You have chest pain.  You have unexpected bleeding.  You feel that you are having a life-threatening or urgent problem. This information is not intended to replace advice given to you by your health care provider. Make sure you discuss any questions you have with your health care provider. Document Released: 12/10/2000 Document Revised: 02/06/2016 Document Reviewed: 08/18/2015 Elsevier Interactive Patient Education  2017 Elsevier Inc.    LAPAROSCOPIC SURGERY: POST OP INSTRUCTIONS  ######################################################################  EAT Gradually transition to a high fiber diet with a fiber supplement over the next few weeks after discharge.  Start with a pureed / full liquid diet (see below)  WALK Walk an hour a day.  Control your pain to do that.    CONTROL PAIN Control pain so that you can walk, sleep, tolerate sneezing/coughing, go up/down stairs.  HAVE A BOWEL MOVEMENT DAILY Keep your bowels regular to avoid problems.  OK to try a laxative to override constipation.  OK to use an antidairrheal to slow down diarrhea.  Call if not better after 2 tries  CALL IF YOU HAVE PROBLEMS/CONCERNS Call if you are still struggling despite following these instructions. Call if you have concerns not answered by these instructions  ######################################################################    1. DIET: Follow a light bland diet the first  24 hours after arrival home, such as soup, liquids, crackers, etc.  Be sure to include lots of fluids daily.  Avoid fast food or heavy meals as your are more likely to get nauseated.  Eat a low fat the next few days after surgery.   2. Take your usually prescribed home medications  unless otherwise directed. 3. PAIN CONTROL: a. Pain is best controlled by a usual combination of three different methods TOGETHER: i. Ice/Heat ii. Over the counter pain medication iii. Prescription pain medication b. Most patients will experience some swelling and bruising around the incisions.  Ice packs or heating pads (30-60 minutes up to 6 times a day) will help. Use ice for the first few days to help decrease swelling and bruising, then switch to heat to help relax tight/sore spots and speed recovery.  Some people prefer to use ice alone, heat alone, alternating between ice & heat.  Experiment to what works for you.  Swelling and bruising can take several weeks to resolve.   c. It is helpful to take an over-the-counter pain medication regularly for the first few weeks.  Choose one of the following that works best for you: i. Naproxen (Aleve, etc)  Two 220mg  tabs twice a day ii. Ibuprofen (Advil, etc) Three 200mg  tabs four times a day (every meal & bedtime) iii. Acetaminophen (Tylenol, etc) 500-650mg  four times a day (every meal & bedtime) d. A  prescription for pain medication (such as oxycodone, hydrocodone, etc) should be given to you upon discharge.  Take your pain medication as prescribed.  i. If you are having problems/concerns with the prescription medicine (does not control pain, nausea, vomiting, rash, itching, etc), please call us [phone] to see if we need to switch you to a different pain medicine that will work better for you and/or control your side effect better. ii. If you need a refill on your pain medication, please contact your pharmacy.  They will contact our office to request authorization. Prescriptions will not be filled after 5 pm or on week-ends. 4. Avoid getting constipated.  Between the surgery and the pain medications, it is common to experience some constipation.  Increasing fluid intake and taking a fiber supplement (such as Metamucil, Citrucel, FiberCon,  MiraLax, etc) 1-2 times a day regularly will usually help prevent this problem from occurring.  A mild laxative (prune juice, Milk of Magnesia, MiraLax, etc) should be taken according to package directions if there are no bowel movements after 48 hours.   5. Watch out for diarrhea.  If you have many loose bowel movements, simplify your diet to bland foods & liquids for a few days.  Stop any stool softeners and decrease your fiber supplement.  Switching to mild anti-diarrheal medications (Kayopectate, Pepto Bismol) can help.  If this worsens or does not improve, please call us. 6. Wash / shower every day.  You may shower over skin glue which is waterproof.   7. Remove your waterproof bandages 5 days after surgery.  You may leave the incision open to air.  You may replace a dressing/Band-Aid to cover the incision for comfort if you wish.  8. ACTIVITIES as tolerated:   a. You may resume regular (light) daily activities beginning the next day--such as daily self-care, walking, climbing stairs--gradually increasing activities as tolerated.  If you can walk 30 minutes without difficulty, it is safe to try more intense activity such as jogging, treadmill, bicycling, low-impact aerobics, swimming, etc. b. Save the most intensive and strenuous activity  for last such as sit-ups, heavy lifting, contact sports, etc  Refrain from any heavy lifting or straining until you are off narcotics for pain control.   c. DO NOT PUSH THROUGH PAIN.  Let pain be your guide: If it hurts to do something, don't do it.  Pain is your body warning you to avoid that activity for another week until the pain goes down. d. You may drive when you are no longer taking prescription pain medication, you can comfortably wear a seatbelt, and you can safely maneuver your car and apply brakes. e. Bonita QuinYou may have sexual intercourse when it is comfortable.  9. FOLLOW UP in our office a. Please call [facility] at [phone] to set up an appointment to see  your surgeon in the office for a follow-up appointment approximately 2-3 weeks after your surgery. b. Make sure that you call for this appointment the day you arrive home to insure a convenient appointment time. 10. IF YOU HAVE DISABILITY OR FAMILY LEAVE FORMS, BRING THEM TO THE OFFICE FOR PROCESSING.  DO NOT GIVE THEM TO YOUR DOCTOR.   WHEN TO CALL US [phone]: 1. Poor pain control 2. Reactions / problems with new medications (rash/itching, nausea, etc)  3. Fever over 101.5 F (38.5 C) 4. Inability to urinate 5. Nausea and/or vomiting 6. Worsening swelling or bruising 7. Continued bleeding from incision. 8. Increased pain, redness, or drainage from the incision   The clinic staff is available to answer your questions during regular business hours (8:30am-5pm).  Please dont hesitate to call and ask to speak to one of our nurses for clinical concerns.   If you have a medical emergency, go to the nearest emergency room or call 911.  [facility] is always on call at the [facility]   [facility], [place] [address], [address], [place], [place]  [address] ? MAIN: [phone] ? TOLL FREE: [phone] ?  FAX [phone] [email or web address]

## 2016-12-04 NOTE — H&P (View-Only) (Signed)
Cassie Gonzalez 11/29/2016 10:57 AM Location: [facility] Patient #: [id number] DOB: 23-Aug-1951 Widowed / Language: [place] / Race: White Female  History of Present Illness (Avrielle Fry A. Fredricka Bonineonnor MD; 11/29/2016 11:26 AM) Patient words: This is a very nice 66 year old woman who is referred for biliary dyskinesia. She has been having symptoms for at least 2 months. She experiences epigastric discomfort which radiates around both sides into her back. The pain is similar to gas pain. It is exacerbated by eating. She does not have any nausea or emesis, but she does have a history of GERD which is well controlled with medication. She has a known small hiatal hernia. She also has a history of chronic constipation followed by GI. She underwent upper endoscopy, colonoscopy, EKG, echocardiogram, treadmill stress test, right upper quadrant ultrasound as part of her workup-all of these were unremarkable. She had labs done in December which as she recalls were normal. She finally underwent a HIDA scan last week that showed a gallbladder ejection fraction of 20%.  She is never had any abdominal surgery. She does have a history of heart murmur and rheumatic fever as a child but again her cardiac workup as above was normal.  The patient is a 66 year old female.   Past Surgical History Juanita Craver(Armen Glenn, CMA; 11/29/2016 10:57 AM) Cataract Surgery Bilateral. Colon Polyp Removal - Colonoscopy  Diagnostic Studies History Juanita Craver(Armen Glenn, CMA; 11/29/2016 10:57 AM) Colonoscopy within last year Mammogram within last year  Allergies Juanita Craver(Armen Glenn, CMA; 11/29/2016 10:59 AM) Sulfa Antibiotics  Medication History (Armen Sherrine MaplesGlenn, CMA; 11/29/2016 11:04 AM) Omeprazole (20MG  Capsule DR, Oral) Active. Simvastatin (10MG  Tablet, Oral) Active. Latanoprost (0.005% Solution, Ophthalmic) Active. Vitamin B12 (1000MCG Tablet ER, Oral) Active. Calcium (1200-1000MG -UNIT Tablet Chewable, Oral) Active. Vitamin D3  (1000UNIT Tablet, Oral) Active. Vitamin C (500MG  Tablet, Oral) Active. Zantac (150MG  Tablet, Oral) Active. Gas-X (80MG  Tablet Chewable, Oral) Active. Align (4MG  Capsule, Oral) Active. MiraLax (Oral) Active. Estrace (1MG  Tablet, Oral) Active. Fluzone High-Dose (0.5ML Susp Pref Syr, Intramuscular) Active. Medications Reconciled  Social History Juanita Craver(Armen Glenn, CMA; 11/29/2016 10:57 AM) Caffeine use Coffee, Tea. No alcohol use Tobacco use Never smoker.  Family History Juanita Craver(Armen Glenn, CMA; 11/29/2016 10:57 AM) Arthritis Mother. Breast Cancer Family Members In General. Colon Cancer Family Members In General. Heart Disease Mother. Heart disease in female family member before age 265 Prostate Cancer Father.  Pregnancy / Birth History Juanita Craver(Armen Glenn, CMA; 11/29/2016 10:57 AM) Age at menarche 9 years. Age of menopause 46>60 Gravida 2 Maternal age 38-25  Other Problems Juanita Craver(Armen Glenn, CMA; 11/29/2016 10:57 AM) Arthritis Bladder Problems Gastroesophageal Reflux Disease Heart murmur     Review of Systems (Armen Glenn CMA; 11/29/2016 10:57 AM) General Present- Appetite Loss, Fatigue, Night Sweats and Weight Loss. Not Present- Chills, Fever and Weight Gain. Skin Not Present- Change in Wart/Mole, Dryness, Hives, Jaundice, New Lesions, Non-Healing Wounds, Rash and Ulcer. HEENT Present- Ringing in the Ears, Seasonal Allergies, Sinus Pain and Wears glasses/contact lenses. Not Present- Earache, Hearing Loss, Hoarseness, Nose Bleed, Oral Ulcers, Sore Throat, Visual Disturbances and Yellow Eyes. Respiratory Present- Snoring. Not Present- Bloody sputum, Chronic Cough, Difficulty Breathing and Wheezing. Breast Not Present- Breast Mass, Breast Pain, Nipple Discharge and Skin Changes. Gastrointestinal Present- Bloating, Constipation and Excessive gas. Not Present- Abdominal Pain, Bloody Stool, Change in Bowel Habits, Chronic diarrhea, Difficulty Swallowing, Gets full quickly at meals,  Hemorrhoids, Indigestion, Nausea, Rectal Pain and Vomiting. Female Genitourinary Present- Nocturia. Not Present- Frequency, Painful Urination, Pelvic Pain and Urgency. Musculoskeletal Present- Back Pain  and Joint Stiffness. Not Present- Joint Pain, Muscle Pain, Muscle Weakness and Swelling of Extremities. Neurological Present- Weakness. Not Present- Decreased Memory, Fainting, Headaches, Numbness, Seizures, Tingling, Tremor and Trouble walking. Psychiatric Not Present- Anxiety, Bipolar, Change in Sleep Pattern, Depression, Fearful and Frequent crying. Endocrine Present- Hot flashes. Not Present- Cold Intolerance, Excessive Hunger, Hair Changes, Heat Intolerance and New Diabetes. Hematology Present- Easy Bruising. Not Present- Blood Thinners, Excessive bleeding, Gland problems, HIV and Persistent Infections.  Vitals (Armen Glenn CMA; 11/29/2016 10:58 AM) 11/29/2016 10:57 AM Weight: 127 lb Height: 64in Body Surface Area: 1.61 m Body Mass Index: 21.8 kg/m  Temp.: 97.54F  Pulse: 64 (Regular)  BP: 128/68 (Sitting, Left Arm, Standard)      Physical Exam ([place] Freyre A. Fredricka Bonine MD; 11/29/2016 11:27 AM)  General Note: She is alert and oriented, no distress  Integumentary Note: No lesions or rashes on limited skin exam  Head and Neck Note: No mass or thyromegaly  Eye Note: Anicteric, extraocular motion intact  ENMT Note: Moist mucous membranes, dentures in place  Chest and Lung Exam Note: Unlabored respirations, symmetrical air entry  Cardiovascular Note: Regular rate and rhythm, no pedal edema  Abdomen Note: Soft, nontender nondistended. No mass or organomegaly  Neurologic Note: Grossly intact, normal gait  Neuropsychiatric Note: Normal mood and affect, appropriate insight  Musculoskeletal Note: Strength symmetrical throughout, no deformity    Assessment & Plan (Evgenia Merriman A. Sederick Jacobsen MD; 11/29/2016 11:29 AM)  BILIARY DYSKINESIA (K82.8) Story: Symptomatic  with pain and about a 10 pound weight loss since January. She is interested in gallbladder surgery. We discussed the nature laparoscopic gallbladder surgery, the expected postoperative recovery, and the risks of bleeding, pain, infection, scarring, common bile duct injury or other ventral abdominal injury and sequelae, conversion to open surgery. She had several appropriate questions all of which were answered. At the end of our conversation she desries lap chole. We will get her scheduled for laparoscopic cholecystectomy as soon as possible.

## 2016-12-04 NOTE — Interval H&P Note (Signed)
History and Physical Interval Note:  12/04/2016 10:58 AM  Cassie Pradereresa S Gonzalez  has presented today for surgery, with the diagnosis of Biliary dyskinesia  The various methods of treatment have been discussed with the patient and family. After consideration of risks, benefits and other options for treatment, the patient has consented to  Procedure(s): LAPAROSCOPIC CHOLECYSTECTOMY (N/A) as a surgical intervention .  The patient's history has been reviewed, patient examined, no change in status, stable for surgery.  I have reviewed the patient's chart and labs.  Questions were answered to the patient's satisfaction.     Javarian Jakubiak Lollie SailsA Kodi Guerrera

## 2016-12-05 ENCOUNTER — Encounter (HOSPITAL_COMMUNITY): Payer: Self-pay | Admitting: Surgery

## 2017-02-15 NOTE — Addendum Note (Signed)
Addendum  created 02/15/17 1114 by Shelton SilvasHollis, Arin Vanosdol D, MD   Sign clinical note

## 2017-03-14 ENCOUNTER — Emergency Department (HOSPITAL_COMMUNITY)
Admission: EM | Admit: 2017-03-14 | Discharge: 2017-03-14 | Disposition: A | Discharge status: Home / Self Care | Payer: Medicare Other | Attending: Emergency Medicine | Admitting: Emergency Medicine

## 2017-03-14 ENCOUNTER — Emergency Department (HOSPITAL_COMMUNITY): Payer: Medicare Other

## 2017-03-14 ENCOUNTER — Encounter (HOSPITAL_COMMUNITY): Payer: Self-pay | Admitting: *Deleted

## 2017-03-14 DIAGNOSIS — R1030 Lower abdominal pain, unspecified: Secondary | ICD-10-CM | POA: Diagnosis present

## 2017-03-14 DIAGNOSIS — K5900 Constipation, unspecified: Secondary | ICD-10-CM | POA: Insufficient documentation

## 2017-03-14 DIAGNOSIS — Z79899 Other long term (current) drug therapy: Secondary | ICD-10-CM | POA: Insufficient documentation

## 2017-03-14 NOTE — ED Triage Notes (Signed)
Pt comes in with lower abdominal pain starting last Wednesday. She was seen by her PCP and told she had a large amount of stool, she was placed on miralax. Starting this Tuesday she went to that bathroom around 8 times. Yesterday, she went only once and none today. She is concerned about a blockage. She still has pain in her lower abdomen (cramping). Pt denies any vomiting. States she is eating well. NAD noted.

## 2017-03-14 NOTE — Discharge Instructions (Signed)
Take your MiraLAX daily. Follow-up with her doctor next week if any problems

## 2017-03-14 NOTE — ED Notes (Signed)
Pt is getting agitated about waiting so long. Alerted Dr. Estell HarpinZammit about pt.'s agitation and states he will go talk to her.

## 2017-03-14 NOTE — ED Provider Notes (Signed)
[facility] Provider Note   CSN: [id number] Arrival date & time: 03/14/17  1045     History   Chief Complaint Chief Complaint  Patient presents with  . Abdominal Pain    HPI Cassie Gonzalez is a 66 y.o. female.  Patient complains of mild constipation.   The history is provided by the patient. No language interpreter was used.  Constipation   This is a recurrent problem. The current episode started 12 to 24 hours ago. The stool is described as formed. Associated symptoms include abdominal pain. There is fiber in the patient's diet. She has tried nothing for the symptoms. The treatment provided mild relief. Risk factors include immobility. Her past medical history does not include endocrine disease.    Past Medical History:  Diagnosis Date  . Arthritis   . Heart murmur    PMH when she had rheumtic fever  . Hypercholesterolemia   . Rheumatic fever    as child    There are no active problems to display for this patient.   Past Surgical History:  Procedure Laterality Date  . CATARACT EXTRACTION     bilateral  . CHOLECYSTECTOMY N/A 12/04/2016   Procedure: LAPAROSCOPIC CHOLECYSTECTOMY;  Surgeon: Berna Bue, MD;  Location: [facility];  Service: General;  Laterality: N/A;  . COLONOSCOPY  03/04/2012   Procedure: COLONOSCOPY;  Surgeon: Dalia Heading, MD;  Location: [facility];  Service: Gastroenterology;  Laterality: N/A;  . DILATION AND CURETTAGE OF UTERUS    . ESOPHAGOGASTRODUODENOSCOPY  03/04/2012   Procedure: ESOPHAGOGASTRODUODENOSCOPY (EGD);  Surgeon: Dalia Heading, MD;  Location: [facility];  Service: Gastroenterology;  Laterality: N/A;    OB History    No data available       Home Medications    Prior to Admission medications   Medication Sig Start Date End Date Taking? Authorizing Provider  amoxicillin-clavulanate (AUGMENTIN) 875-125 MG tablet Take 1 tablet by mouth every 12 (twelve) hours. 03/06/17  Yes [provider]  Calcium  Carbonate-Vitamin D (CALTRATE 600+D) 600-400 MG-UNIT per tablet Take 1 tablet by mouth 2 (two) times daily.   Yes [provider]  Cyanocobalamin (B-12) 2500 MCG TABS Take 2,500 mcg by mouth every morning.    Yes [provider]  estradiol (ESTRACE) 0.1 MG/GM vaginal cream Place 1 Applicatorful vaginally See admin instructions. Twice a week on Tuesdays and Fridays.   Yes [provider]  guaifenesin (HUMIBID E) 400 MG TABS tablet Take 400 mg by mouth 2 (two) times daily as needed (sinus congestion).   Yes [provider]  latanoprost (XALATAN) 0.005 % ophthalmic solution Place 1 drop into both eyes at bedtime. 11/15/16  Yes [provider]  Liniments (BLUE-EMU SUPER STRENGTH) CREA Apply 1 application topically 2 (two) times daily.   Yes [provider]  polyethylene glycol (MIRALAX / GLYCOLAX) packet Take 17 g by mouth at bedtime.   Yes [provider]  Propylene Glycol (SYSTANE BALANCE) 0.6 % SOLN Place 1 drop into both eyes 2 (two) times daily as needed. For dry eyes    Yes [provider]  ranitidine (ZANTAC) 150 MG tablet Take 150 mg by mouth at bedtime as needed for heartburn.   Yes [provider]  simethicone (MYLICON) 80 MG chewable tablet Chew 80 mg by mouth every 6 (six) hours as needed for flatulence.   Yes [provider]  fluconazole (DIFLUCAN) 150 MG tablet Take 1 tablet by mouth as needed for rash. 03/02/17  [provider]  HYDROcodone-acetaminophen (NORCO/VICODIN) 5-325 MG tablet Take 1 tablet by mouth every 6 (six) hours as needed for moderate pain. Patient not taking: Reported on 03/14/2017 12/04/16   Berna Bue, MD  omeprazole (PRILOSEC) 20 MG capsule Take 20 mg by mouth daily. 11/09/16   [provider]    Family History No family history on file.  Social History Social History  Substance Use Topics  . Smoking status: Never Smoker  . Smokeless tobacco: Never Used    . Alcohol use No     Allergies   Sulfa antibiotics   Review of Systems Review of Systems  Constitutional: Negative for appetite change and fatigue.  HENT: Negative for congestion, ear discharge and sinus pressure.   Eyes: Negative for discharge.  Respiratory: Negative for cough.   Cardiovascular: Negative for chest pain.  Gastrointestinal: Positive for abdominal pain and constipation. Negative for diarrhea.  Genitourinary: Negative for frequency and hematuria.  Musculoskeletal: Negative for back pain.  Skin: Negative for rash.  Neurological: Negative for seizures and headaches.  Psychiatric/Behavioral: Negative for hallucinations.     Physical Exam Updated Vital Signs BP (!) 143/74 (BP Location: Right Arm)   Pulse 97   Temp 97.7 F (36.5 C) (Oral)   Resp 16   Ht 5\' 4"  (1.626 m)   Wt 51.3 kg (113 lb)   SpO2 99%   BMI 19.40 kg/m   Physical Exam  Constitutional: She is oriented to person, place, and time. She appears well-developed.  HENT:  Head: Normocephalic.  Eyes: Conjunctivae and EOM are normal. No scleral icterus.  Neck: Neck supple. No thyromegaly present.  Cardiovascular: Normal rate and regular rhythm.  Exam reveals no gallop and no friction rub.   No murmur heard. Pulmonary/Chest: No stridor. She has no wheezes. She has no rales. She exhibits no tenderness.  Abdominal: She exhibits no distension. There is no tenderness. There is no rebound.  Musculoskeletal: Normal range of motion. She exhibits no edema.  Lymphadenopathy:    She has no cervical adenopathy.  Neurological: She is oriented to person, place, and time. She exhibits normal muscle tone. Coordination normal.  Skin: No rash noted. No erythema.  Psychiatric: She has a normal mood and affect. Her behavior is normal.     ED Treatments / Results  Labs (all labs ordered are listed, but only abnormal results are displayed) Labs Reviewed - No data to display  EKG  EKG Interpretation None        Radiology Dg Abd Acute W/chest  Result Date: 03/14/2017 CLINICAL DATA:  Abdominal pain EXAM: DG ABDOMEN ACUTE W/ 1V CHEST COMPARISON:  Chest two-view 11/30/2016 FINDINGS: Pulmonary hyperinflation with prominent markings compatible with COPD. No acute infiltrate. Negative for heart failure or effusion Moderate stool throughout the colon. Negative for bowel obstruction. No free air. Surgical clips the gallbladder fossa. IMPRESSION: COPD, no acute abnormality Moderate stool in the colon.  Nonobstructive bowel gas pattern. Electronically Signed   By: Marlan Palau M.D.   On: 03/14/2017 12:44    Procedures Procedures (including critical care time)  Medications Ordered in ED Medications - No data to display   Initial Impression / Assessment and Plan / ED Course  I have reviewed the triage vital signs and the nursing notes.  Pertinent labs & imaging results that were available during my care of the patient were reviewed by me and considered in my medical decision making (see chart for details).     Patient of mild constipation she  will take MiraLAX daily  Final Clinical Impressions(s) / ED Diagnoses   Final diagnoses:  Constipation, unspecified constipation type    New Prescriptions New Prescriptions   No medications on file     Bethann BerkshireZammit, Kippy Gohman, MD 03/14/17 [phone]

## 2017-04-30 ENCOUNTER — Ambulatory Visit
Admission: RE | Admit: 2017-04-30 | Discharge: 2017-04-30 | Disposition: A | Discharge status: Home / Self Care | Payer: Medicare Other | Source: Ambulatory Visit | Attending: Gastroenterology | Admitting: Gastroenterology

## 2017-04-30 ENCOUNTER — Other Ambulatory Visit: Payer: Self-pay | Admitting: Gastroenterology

## 2017-04-30 DIAGNOSIS — K5900 Constipation, unspecified: Secondary | ICD-10-CM

## 2017-04-30 DIAGNOSIS — R1084 Generalized abdominal pain: Secondary | ICD-10-CM

## 2017-06-19 ENCOUNTER — Ambulatory Visit: Payer: Medicare Other | Admitting: Gastroenterology

## 2017-11-01 ENCOUNTER — Other Ambulatory Visit: Payer: Self-pay | Admitting: Gastroenterology

## 2017-11-01 ENCOUNTER — Ambulatory Visit
Admission: RE | Admit: 2017-11-01 | Discharge: 2017-11-01 | Disposition: A | Discharge status: Home / Self Care | Payer: Medicare Other | Source: Ambulatory Visit | Attending: Gastroenterology | Admitting: Gastroenterology

## 2017-11-01 DIAGNOSIS — K5900 Constipation, unspecified: Secondary | ICD-10-CM

## 2017-11-01 DIAGNOSIS — R140 Abdominal distension (gaseous): Secondary | ICD-10-CM

## 2017-11-01 DIAGNOSIS — R1011 Right upper quadrant pain: Secondary | ICD-10-CM

## 2019-01-19 ENCOUNTER — Encounter: Payer: Self-pay | Admitting: *Deleted

## 2019-01-22 NOTE — Progress Notes (Signed)
New Patient Virtual Visit via Video Note The purpose of this virtual visit is to provide medical care while limiting exposure to the novel coronavirus.    Consent was obtained for video visit:  Yes.   Answered questions that patient had about telehealth interaction:  Yes.   I discussed the limitations, risks, security and privacy concerns of performing an evaluation and management service by telemedicine. I also discussed with the patient that there may be a patient responsible charge related to this service. The patient expressed understanding and agreed to proceed.  Pt location: Home Physician Location: office Name of referring provider:  Jonathon Bellows, DO I connected with Cassie Gonzalez at patients initiation/request on 01/23/2019 at  2:00 PM EDT by video enabled telemedicine application and verified that I am speaking with the correct person using two identifiers. Pt MRN:  [id number] Pt DOB:  [birth year] Video Participants:  Cassie Gonzalez    History of Present Illness: Cassie Gonzalez is a 68 y.o. right-handed Caucasian female with hyperlipidemia, anxiety, and GERD presenting for evaluation of paresthesias.  For several years, she has had skin sensitivity and burning in various locations, which is worse at the back of her neck at the hairline, and cheeks, and soles of the feet.  She has seen dermatology for the symptoms and was diagnosed with eczema and given corticosteroid cream to apply, which has helped the burning and tingling over the base of the hand and cheeks.  She was also recently started on gabapentin 300 mg at bedtime for these symptoms, however she has very little benefit.  Numbness and tingling of the feet involves the soles and is worse at bedtime.  She does not have associated weakness or imbalance.  No history of diabetes, alcohol use or eventually neuropathy.  Her mother had neuropathy due to diabetes.  She has sensitive skin for several years and describes  various locations of tingling and burning.  She has seen dermatology and was given fluticasone which helped some.    Prior laboratory testing has included hemoglobin A1c of 4.7 from May 2019.  She is on a number of over-the-counter supplements including vitamin B12.   Past Medical History:  Diagnosis Date  . Anxiety   . Arthritis   . Heart murmur    PMH when she had rheumtic fever  . Hypercholesterolemia   . Rheumatic fever    as child    Past Surgical History:  Procedure Laterality Date  . CATARACT EXTRACTION     bilateral  . CHOLECYSTECTOMY N/A 12/04/2016   Procedure: LAPAROSCOPIC CHOLECYSTECTOMY;  Surgeon: Berna Bue, MD;  Location: [facility];  Service: General;  Laterality: N/A;  . COLONOSCOPY  03/04/2012   Procedure: COLONOSCOPY;  Surgeon: Dalia Heading, MD;  Location: [facility];  Service: Gastroenterology;  Laterality: N/A;  . DILATION AND CURETTAGE OF UTERUS    . ESOPHAGOGASTRODUODENOSCOPY  03/04/2012   Procedure: ESOPHAGOGASTRODUODENOSCOPY (EGD);  Surgeon: Dalia Heading, MD;  Location: [facility];  Service: Gastroenterology;  Laterality: N/A;     Medications:  Outpatient Encounter Medications as of 01/23/2019  Medication Sig Note  . Calcium Carbonate-Vitamin D (CALTRATE 600+D) 600-400 MG-UNIT per tablet Take 1 tablet by mouth 2 (two) times daily.   . Cyanocobalamin (B-12) 2500 MCG TABS Take 2,500 mcg by mouth every morning.  03/14/2017: Stopped taking  . estradiol (ESTRACE) 0.1 MG/GM vaginal cream Place 1 Applicatorful vaginally See admin instructions. Twice a week on Tuesdays and Fridays.   Marland Kitchen  fluocinolone (SYNALAR) 0.01 % external solution APPLY EVERY OTHER NIGHT AND WASH OUT IN THE MORNING WITH SHAMPOO(SKIP WEEKENDS)   . gabapentin (NEURONTIN) 100 MG capsule Take 300 mg by mouth at bedtime.    Marland Kitchen. guaifenesin (HUMIBID E) 400 MG TABS tablet Take 400 mg by mouth 2 (two) times daily as needed (sinus congestion).   Marland Kitchen. latanoprost (XALATAN) 0.005 % ophthalmic solution  Place 1 drop into both eyes at bedtime.   Marland Kitchen. nystatin ointment (MYCOSTATIN) APPLY TO LIPS ONCE A DAY AS NEEDED   . Probiotic Product (ALIGN) 4 MG CAPS Take by mouth.   . triamcinolone cream (KENALOG) 0.1 % APPLY TO AFFECTED AREA TWICE A DAY   . famotidine (PEPCID) 20 MG tablet Take 20 mg by mouth 2 (two) times daily.   . fluconazole (DIFLUCAN) 150 MG tablet Take 1 tablet by mouth as needed for rash.   . [DISCONTINUED] amoxicillin-clavulanate (AUGMENTIN) 875-125 MG tablet Take 1 tablet by mouth every 12 (twelve) hours.   . [DISCONTINUED] HYDROcodone-acetaminophen (NORCO/VICODIN) 5-325 MG tablet Take 1 tablet by mouth every 6 (six) hours as needed for moderate pain. (Patient not taking: Reported on 03/14/2017)   . [DISCONTINUED] Liniments (BLUE-EMU SUPER STRENGTH) CREA Apply 1 application topically 2 (two) times daily.   . [DISCONTINUED] omeprazole (PRILOSEC) 20 MG capsule Take 20 mg by mouth daily.   . [DISCONTINUED] polyethylene glycol (MIRALAX / GLYCOLAX) packet Take 17 g by mouth at bedtime.   . [DISCONTINUED] Propylene Glycol (SYSTANE BALANCE) 0.6 % SOLN Place 1 drop into both eyes 2 (two) times daily as needed. For dry eyes    . [DISCONTINUED] ranitidine (ZANTAC) 150 MG tablet Take 150 mg by mouth at bedtime as needed for heartburn.   . [DISCONTINUED] simethicone (MYLICON) 80 MG chewable tablet Chew 80 mg by mouth every 6 (six) hours as needed for flatulence.    No facility-administered encounter medications on file as of 01/23/2019.     Allergies:  Allergies  Allergen Reactions  . Sulfa Antibiotics Rash    Family History: Family History  Problem Relation Age of Onset  . Diabetes Mother   . Heart disease Mother   . Neuropathy Mother   . Hyperlipidemia Mother   . Prostate cancer Father   . Heart disease Sister     Social History: Social History   Tobacco Use  . Smoking status: Never Smoker  . Smokeless tobacco: Never Used  Substance Use Topics  . Alcohol use: No  . Drug use:  No   Social History   Social History Narrative   Lives alone in a one story home.  Has 2 children.     Retired Lobbyistmath instructor.     Education: Best boyDoctorate.    Review of Systems:  CONSTITUTIONAL: No fevers, chills, night sweats, or weight loss.   EYES: No visual changes or eye pain ENT: No hearing changes.  No history of nose bleeds.   RESPIRATORY: No cough, wheezing and shortness of breath.   CARDIOVASCULAR: Negative for chest pain, and palpitations.   GI: Negative for abdominal discomfort, blood in stools or black stools.  No recent change in bowel habits.   GU:  No history of incontinence.   MUSCLOSKELETAL: No history of joint pain or swelling.  No myalgias.   SKIN: Negative for lesions, rash, and itching.   HEMATOLOGY/ONCOLOGY: Negative for prolonged bleeding, bruising easily, and swollen nodes.  No history of cancer.   ENDOCRINE: Negative for cold or heat intolerance, polydipsia or goiter.   PSYCH:  No depression or anxiety symptoms.   NEURO: As Above.   Vital Signs:  Ht 5\' 4"  (1.626 m)   Wt 135 lb (61.2 kg)   BMI 23.17 kg/m    General Medical Exam:  Well appearing, comfortable.  Nonlabored breathing.  No deformity or edema.  No rash.  Neurological Exam: MENTAL STATUS including orientation to time, place, person, recent and remote memory, attention span and concentration, language, and fund of knowledge is normal.  Speech is not dysarthric.  CRANIAL NERVES:  Normal conjugate, extra-ocular eye movements in all directions of gaze.  No ptosis.  Normal facial symmetry and movements.  Normal shoulder shrug and head rotation.  Tongue is midline.  MOTOR:  Antigravity in all extremities.  No abnormal movements.  No pronator drift.   COORDINATION/GAIT: Normal finger to nose bilaterally.  Intact rapid alternating movements bilaterally.  Able to rise from a chair without using arms.  Gait narrow based and stable.    IMPRESSION/PLAN: 1.  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  She may wish to reduce this as she was informed that gabapentin provides relief for quality of life, however does not heal nerves  2.  Facial paresthesias and skin sensitivity is most likely secondary to underlying dermatological condition of eczema, especially as she has relief of symptoms with steroid cream.   Follow Up Instructions:  I discussed the assessment and treatment plan with the patient. The patient was provided an opportunity to ask questions and all were answered. The patient agreed with the plan and demonstrated an understanding of the instructions.   The patient was advised to call back or seek an in-person evaluation if the symptoms worsen or if the condition fails to improve as anticipated.   Glendale Chard, DO

## 2019-01-23 ENCOUNTER — Encounter: Payer: Self-pay | Admitting: *Deleted

## 2019-01-23 ENCOUNTER — Telehealth (INDEPENDENT_AMBULATORY_CARE_PROVIDER_SITE_OTHER): Payer: Medicare Other | Admitting: Neurology

## 2019-01-23 ENCOUNTER — Encounter: Payer: Self-pay | Admitting: Neurology

## 2019-01-23 ENCOUNTER — Other Ambulatory Visit: Payer: Self-pay

## 2019-01-23 VITALS — Ht 64.0 in | Wt 135.0 lb

## 2019-01-23 DIAGNOSIS — R209 Unspecified disturbances of skin sensation: Secondary | ICD-10-CM

## 2019-01-23 DIAGNOSIS — R202 Paresthesia of skin: Secondary | ICD-10-CM

## 2019-02-20 ENCOUNTER — Telehealth: Payer: Self-pay | Admitting: Neurology

## 2019-02-20 NOTE — Telephone Encounter (Signed)
-----   Message from Rosezena Sensor sent at 02/20/2019  8:55 AM EDT ----- Pt does not want to do a EMG right now and will call back to sch one at a later date if she wants to move forward with that

## 2019-02-20 NOTE — Telephone Encounter (Signed)
Noted  

## 2020-03-16 ENCOUNTER — Encounter: Payer: Self-pay | Admitting: Allergy & Immunology

## 2020-03-16 ENCOUNTER — Ambulatory Visit (INDEPENDENT_AMBULATORY_CARE_PROVIDER_SITE_OTHER): Payer: Medicare Other | Admitting: Allergy & Immunology

## 2020-03-16 ENCOUNTER — Other Ambulatory Visit: Payer: Self-pay

## 2020-03-16 VITALS — BP 120/60 | HR 87 | Temp 98.0°F | Resp 16 | Ht 64.5 in | Wt 140.0 lb

## 2020-03-16 DIAGNOSIS — J3089 Other allergic rhinitis: Secondary | ICD-10-CM

## 2020-03-16 MED ORDER — IPRATROPIUM BROMIDE 0.06 % NA SOLN: 2.0000 | Freq: Three times a day (TID) | NASAL | 5 refills | Status: DC

## 2020-03-16 MED ORDER — BUDESONIDE 0.5 MG/2ML IN SUSP: RESPIRATORY_TRACT | 1 refills | Status: DC

## 2020-03-16 NOTE — Patient Instructions (Addendum)
1. 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 - Start Atrovent nasal spray- using 1 spray each nostril every 8 hours as needed for RUNNY NOSE - May continue allegra once a day as needed for runny nose.  2. Return in about 3 months (around 06/16/2020). This can be an in-person, a virtual Webex or a telephone follow up visit.   Please inform us of any Emergency Department visits, hospitalizations, or changes in symptoms. Call us before going to the ED for breathing or allergy symptoms since we might be able to fit you in for a sick visit. Feel free to contact us anytime with any questions, problems, or concerns.  It was a pleasure to meet you today!  Websites that have reliable patient information: 1. American Academy of Asthma, Allergy, and Immunology: www.aaaai.org 2. Food Allergy Research and Education (FARE): foodallergy.org 3. Mothers of Asthmatics: http://www.asthmacommunitynetwork.org 4. American College of Allergy, Asthma, and Immunology: www.acaai.org   COVID-19 Vaccine Information can be found at: PodExchange.[email or web address] For questions related to vaccine distribution or appointments, please email [email or web address] or call [phone].     "Like" [place] on Facebook and Instagram for our latest updates!        Make sure you are registered to vote! If you have moved or changed any of your contact information, you will need to get this updated before voting!  In some cases, you MAY be able to register to vote online: [email or web address]    Budesonide (Pulmicort) + Saline Irrigation/Rinse   Budesonide (Pulmicort) is an anti-inflammatory steroid medication used to decrease nasal and sinus inflammation. It is dispensed in liquid form in a vial. Although  it is manufactured for use with a nebulizer, we intend for you to use it with the NeilMed Sinus Rinse bottle (preferred) or a Neti pot.    Instructions:  1) Make of saline in the NeilMed bottle using the salt packets or your own saline recipe (see separate handout).  2) Add the entire 48mL vial of liquid Budesonide (Pulmicort) to the rinse bottle and mix together.  3) While in the shower or over the sink, tilt your head forward to a comfortable level. Put the tip of the sinus rinse bottle in your nostril and aim it towards the crown or top of your head. Gently squeeze the bottle to flush out your nose. The fluid will circulate in and out of your sinus cavities, coming back out from either nostril or through your mouth. Try not to swallow large quantities and spit it out instead.  4) Perform Budesonide (Pulmicort) + Saline irrigations 2 times daily.   Control of Dog or Cat Allergen  Avoidance is the best way to manage a dog or cat allergy. If you have a dog or cat and are allergic to dog or cats, consider removing the dog or cat from the home. If you have a dog or cat but don't want to find it a new home, or if your family wants a pet even though someone in the household is allergic, here are some strategies that may help keep symptoms at bay:  1. Keep the pet out of your bedroom and restrict it to only a few rooms. Be advised that keeping the dog or cat in only one room will not limit the allergens to that room. 2. Don't pet, hug or kiss the dog or  cat; if you do, wash your hands with soap and water. 3. High-efficiency particulate air (HEPA) cleaners run continuously in a bedroom or living room can reduce allergen levels over time. 4. Regular use of a high-efficiency vacuum cleaner or a central vacuum can reduce allergen levels. 5. Giving your dog or cat a bath at least once a week can reduce airborne allergen.

## 2020-03-16 NOTE — Progress Notes (Signed)
NEW PATIENT  Date of Service/Encounter:  03/16/20  Referring provider: Jonathon BellowsMcGee, Rachel, DO   Assessment:   Perennial allergic rhinitis with overlying non-allergic rhinitis  Plan/Recommendations:   1. 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 - Start Atrovent nasal spray- using 1 spray each nostril every 8 hours as needed for RUNNY NOSE - May continue allegra once a day as needed for runny nose.  2. Return in about 3 months (around 06/16/2020). This can be an in-person, a virtual Webex or a telephone follow up visit.  Subjective:   Cassie Gonzalez is a 69 y.o. female presenting today for evaluation of  Chief Complaint  Patient presents with  . Sinus Problem    Cassie Gonzalez has a history of the following: There are no problems to display for this patient.   History obtained from: chart review and patient.  Cassie Pradereresa S Feldstein was referred by Jonathon BellowsMcGee, Rachel, DO.     Cassie Gonzalez is a 69 y.o. female presenting for an evaluation of mucous and sinus drainage.  She has been using Mucinex BID and Allegra daily for years. She has stopped taking Mucinex the last few days and she has actually done well. She has not had any headaches in the past few days also.    Asthma/Respiratory Symptom History: She denies any history of asthma or respiratory symptoms.  Allergic Rhinitis Symptom History: She reports that she has had mucous and sinus drainage her whole life. She also reports over the past 2-3 weeks she has had off/on sinus headaches above her eyes. the pain is described as aching. She will take Motrin and this will relieve her headache. At times when she has her headaches she will have flashes in her eyes where areas are blocked out. She had one while she was at her eye doctors office 1-2 months ago and she was told that she was having symptoms  of migraines. For the past 3 days she has not had a headache. She also reports occasional clear rhinorrhea and nasal congestion. She has tried Flonase in the past and saline rinse and Simply Saline. She feels like the Saline rinse and Simply saline help with her symptoms. She has seen an allergist approximately 2 years ago and was skin tested for environmental inhalents and foods and they were all negative. Her allergy symptoms are year round, but worse in the spring and fall.  Food Allergy Symptom History: She denies any food allergies,but reports gas since having her gallbladder removed..  She did have blood work done in June 2020 2019.  She was negative to pork, clam, crab, shrimp, scallop, oyster, and lobster.  She also had a mold panel performed which was negative.  Wheat IgE was negative.  Cat IgE was negative.  Beef IgE was negative.  An environmental allergy panel was negative to the entire panel.  She also had additional food testing that was negative.  Eczema Symptom History: She reports that she developed eczema on the back of her head that started a couple years ago. She saw  Dr. Jean RosenthalJackson, a dermatologist, and was given a cortisone liquid gel. She is unsure of the name, but reports that it helps clear her eczema up. She has not had to use this medication in quite a while.  Otherwise, there is no history of other atopic diseases, including asthma, food  allergies, drug allergies, environmental allergies, eczema, urticaria or contact dermatitis. There is no significant infectious history.  Her vaccinations are up to date.    Past Medical History: There are no problems to display for this patient.   Medication List:  Allergies as of 03/16/2020      Reactions   Sulfa Antibiotics Rash   Sulfamethoxazole Rash      Medication List       Accurate as of March 16, 2020 11:51 PM. If you have any questions, ask your nurse or doctor.        STOP taking these medications   Caltrate 600+D  600-400 MG-UNIT tablet Generic drug: Calcium Carbonate-Vitamin D Stopped by: Alfonse Spruce, MD     TAKE these medications   Align 4 MG Caps Take by mouth.   ascorbic acid 500 MG tablet Commonly known as: VITAMIN C Take by mouth.   B-12 2500 MCG Tabs Take 2,500 mcg by mouth every morning.   budesonide 0.5 MG/2ML nebulizer solution Commonly known as: PULMICORT Use as directed. Started by: Alfonse Spruce, MD   calcium carbonate 1500 (600 Ca) MG Tabs tablet Commonly known as: OSCAL Take by mouth.   estradiol 0.1 MG/GM vaginal cream Commonly known as: ESTRACE Place 1 Applicatorful vaginally See admin instructions. Twice a week on Tuesdays and Fridays.   famotidine 20 MG tablet Commonly known as: PEPCID Take 20 mg by mouth 2 (two) times daily.   fexofenadine 180 MG tablet Commonly known as: ALLEGRA Take 180 mg by mouth daily.   fluconazole 150 MG tablet Commonly known as: DIFLUCAN Take 1 tablet by mouth as needed for rash.   fluocinolone 0.01 % external solution Commonly known as: SYNALAR APPLY EVERY OTHER NIGHT AND WASH OUT IN THE MORNING WITH SHAMPOO(SKIP WEEKENDS)   gabapentin 100 MG capsule Commonly known as: NEURONTIN Take 300 mg by mouth at bedtime.   guaifenesin 400 MG Tabs tablet Commonly known as: HUMIBID E Take 400 mg by mouth 2 (two) times daily as needed (sinus congestion).   ipratropium 0.06 % nasal spray Commonly known as: ATROVENT Place 2 sprays into both nostrils 3 (three) times daily. Started by: Alfonse Spruce, MD   latanoprost 0.005 % ophthalmic solution Commonly known as: XALATAN Place 1 drop into both eyes at bedtime.   nystatin ointment Commonly known as: MYCOSTATIN APPLY TO LIPS ONCE A DAY AS NEEDED   polyethylene glycol powder 17 GM/SCOOP powder Commonly known as: GLYCOLAX/MIRALAX Take by mouth.   triamcinolone cream 0.1 % Commonly known as: KENALOG APPLY TO AFFECTED AREA TWICE A DAY   ZINC PO Take 50 mg  by mouth.       Past Surgical History: Past Surgical History:  Procedure Laterality Date  . CATARACT EXTRACTION     bilateral  . CHOLECYSTECTOMY N/A 12/04/2016   Procedure: LAPAROSCOPIC CHOLECYSTECTOMY;  Surgeon: Berna Bue, MD;  Location: [facility];  Service: General;  Laterality: N/A;  . COLONOSCOPY  03/04/2012   Procedure: COLONOSCOPY;  Surgeon: Dalia Heading, MD;  Location: [facility];  Service: Gastroenterology;  Laterality: N/A;  . DILATION AND CURETTAGE OF UTERUS    . ESOPHAGOGASTRODUODENOSCOPY  03/04/2012   Procedure: ESOPHAGOGASTRODUODENOSCOPY (EGD);  Surgeon: Dalia Heading, MD;  Location: [facility];  Service: Gastroenterology;  Laterality: N/A;     Family History: Family History  Problem Relation Age of Onset  . Diabetes Mother   . Heart disease Mother   . Neuropathy Mother   . Hyperlipidemia Mother   .  Allergic rhinitis Mother   . Prostate cancer Father   . Heart disease Sister      Social History: Murphy lives at home by herself.  She lives in a house that is 57 and half years old.  There is carpeting in the main living areas as well as the bedroom.  He has a heat pump for heating and cooling.  There are no animals inside or outside of the home.  She does not have dust mite covers on the bedding.  There is no tobacco exposure.  She is currently retired.  There are no fume, chemical, or dust exposures.    Review of Systems  Constitutional: Negative for chills, fever and weight loss.  HENT: Positive for congestion. Negative for sore throat.   Eyes: Negative for blurred vision.  Respiratory: Negative for cough, shortness of breath and wheezing.   Cardiovascular: Negative for chest pain and palpitations.  Gastrointestinal: Positive for heartburn. Negative for nausea and vomiting.  Genitourinary: Negative for dysuria.  Skin: Negative for itching and rash.  Neurological: Positive for headaches.       Objective:   Blood pressure 120/60, pulse 87,  temperature 98 F (36.7 C), temperature source Temporal, resp. rate 16, height 5' 4.5" (1.638 m), weight 140 lb (63.5 kg), SpO2 95 %. Body mass index is 23.66 kg/m.   Physical Exam:   Physical Exam Vitals reviewed.  Constitutional:      Appearance: Normal appearance.  HENT:     Head: Normocephalic and atraumatic.     Comments: Pharynx normal. Eyes normal. Ears normal. Nose normal    Right Ear: Tympanic membrane, ear canal and external ear normal.     Left Ear: Tympanic membrane, ear canal and external ear normal.     Nose: Nose normal.     Mouth/Throat:     Mouth: Mucous membranes are moist.     Pharynx: Oropharynx is clear.  Eyes:     Conjunctiva/sclera: Conjunctivae normal.  Cardiovascular:     Rate and Rhythm: Normal rate and regular rhythm.     Heart sounds: Normal heart sounds.  Pulmonary:     Effort: Pulmonary effort is normal.     Breath sounds: Normal breath sounds.     Comments: Lungs clear to auscultation Skin:    General: Skin is warm.  Neurological:     Mental Status: She is alert and oriented to person, place, and time.  Psychiatric:        Mood and Affect: Mood normal.        Behavior: Behavior normal.        Thought Content: Thought content normal.        Judgment: Judgment normal.      Diagnostic studies:    Allergy Studies:     Airborne Adult Perc - 03/16/20 1432    Time Antigen Placed 1415    Allergen Manufacturer Waynette Buttery    Location Back    Number of Test 59    1. Control-Buffer 50% Glycerol Negative    2. Control-Histamine 1 mg/ml 2+    3. Albumin saline Negative    4. Bahia Negative    5. French Southern Territories Negative    6. Johnson Negative    7. Kentucky Blue Negative    8. Meadow Fescue Negative    9. Perennial Rye Negative    10. Sweet Vernal Negative    11. Timothy Negative    12. Cocklebur Negative    13. Burweed Marshelder Negative    14.  Ragweed, short Negative    15. Ragweed, Giant Negative    16. Plantain,  English Negative    17.  Lamb's Quarters Negative    18. Sheep Sorrell Negative    19. Rough Pigweed Negative    20. Marsh Elder, Rough Negative    21. Mugwort, Common Negative    22. Ash mix Negative    23. Birch mix Negative    24. Beech American Negative    25. Box, Elder Negative    26. Cedar, red Negative    27. Cottonwood, Guinea-Bissau Negative    28. Elm mix Negative    29. Hickory Negative    30. Maple mix Negative    31. Oak, Guinea-Bissau mix Negative    32. Pecan Pollen Negative    33. Pine mix Negative    34. Sycamore Eastern Negative    35. Walnut, Black Pollen Negative    36. Alternaria alternata Negative    37. Cladosporium Herbarum Negative    38. Aspergillus mix Negative    39. Penicillium mix Negative    40. Bipolaris sorokiniana (Helminthosporium) Negative    41. Drechslera spicifera (Curvularia) Negative    42. Mucor plumbeus Negative    43. Fusarium moniliforme Negative    44. Aureobasidium pullulans (pullulara) Negative    45. Rhizopus oryzae Negative    46. Botrytis cinera Negative    47. Epicoccum nigrum Negative    48. Phoma betae Negative    49. Candida Albicans Negative    50. Trichophyton mentagrophytes Negative    51. Mite, D Farinae  5,000 AU/ml Negative    52. Mite, D Pteronyssinus  5,000 AU/ml Negative    53. Cat Hair 10,000 BAU/ml Negative    54.  Dog Epithelia Negative    55. Mixed Feathers Negative    56. Horse Epithelia Negative    57. Cockroach, German Negative    58. Mouse 2+    59. Tobacco Leaf Negative          Intradermal - 03/16/20 1512    Time Antigen Placed 1500    Allergen Manufacturer Waynette Buttery    Location Arm    Number of Test 15    Intradermal Select    Control Negative    French Southern Territories Negative    Johnson Negative    7 Grass Negative    Ragweed mix Negative    Weed mix Negative    Tree mix Negative    Mold 1 Negative    Mold 2 Negative    Mold 3 Negative    Mold 4 Negative    Cat 1+    Dog Negative    Cockroach Negative    Mite mix Negative             Allergy testing results were read and interpreted by myself, documented by clinical staff.    Thank you for the opportunity to care for [facility]. Please do not hesitate to contact me with any questions.  Nehemiah Settle, FNP [facility]   I performed a history and physical examination of the patient and discussed her management with the Nurse Practitioner. I reviewed the Nurse Practitioner's note and agree with the documented findings and plan of care. The note in its entirety was edited by myself, including the physical exam, assessment, and plan.    Malachi Bonds, MD [facility]

## 2020-03-17 ENCOUNTER — Encounter: Payer: Self-pay | Admitting: Allergy & Immunology

## 2020-03-24 ENCOUNTER — Telehealth: Payer: Self-pay | Admitting: Allergy & Immunology

## 2020-03-24 MED ORDER — BUDESONIDE 0.5 MG/2ML IN SUSP: RESPIRATORY_TRACT | 1 refills | Status: DC

## 2020-03-24 NOTE — Telephone Encounter (Signed)
Prescription sent again and verbal confirmation from pharmacy stating they had received it. I have made patient aware.

## 2020-03-24 NOTE — Telephone Encounter (Addendum)
Patient called and states that the [facility] pharmacy did not have her Pulmicort. Patient received the Atrovent but not Pulmicort. Patient was informed that it shows the pharmacy confirmed the prescription on 03/16/2020.  Patient would like our office to call the pharmacy.   Please advise.

## 2020-04-01 MED ORDER — BUDESONIDE 0.5 MG/2ML IN SUSP: RESPIRATORY_TRACT | 1 refills | Status: DC

## 2020-04-01 NOTE — Telephone Encounter (Signed)
Marchelle Folks from [facility] called and states that the nebulizer solution budesonide that was called in is not covered by patient's medicare. However the pharmacy can get it covered if the prescription is resent with a diagnosis code and specific instructions including the frequency patient can use medication.  Please advise.

## 2020-04-01 NOTE — Telephone Encounter (Signed)
Per Dr. Ellouise Newer verbal instruction I am going to use the diagnosis for dyspnea for this. Updated prescription has been sent in as well.

## 2020-04-01 NOTE — Addendum Note (Signed)
Addended by: Mliss Fritz I on: 04/01/2020 11:25 AM   Modules accepted: Orders

## 2020-04-14 ENCOUNTER — Other Ambulatory Visit: Payer: Self-pay | Admitting: Allergy & Immunology

## 2020-06-17 ENCOUNTER — Ambulatory Visit: Payer: Medicare Other | Admitting: Allergy & Immunology

## 2022-02-12 ENCOUNTER — Encounter (HOSPITAL_COMMUNITY): Payer: Self-pay | Admitting: Emergency Medicine

## 2022-02-12 ENCOUNTER — Other Ambulatory Visit: Payer: Self-pay

## 2022-02-12 ENCOUNTER — Emergency Department (HOSPITAL_COMMUNITY): Payer: Medicare Other

## 2022-02-12 ENCOUNTER — Emergency Department (HOSPITAL_COMMUNITY)
Admission: EM | Admit: 2022-02-12 | Discharge: 2022-02-12 | Disposition: A | Discharge status: Home / Self Care | Payer: Medicare Other | Attending: Emergency Medicine | Admitting: Emergency Medicine

## 2022-02-12 DIAGNOSIS — R202 Paresthesia of skin: Secondary | ICD-10-CM | POA: Diagnosis present

## 2022-02-12 LAB — CBC WITH DIFFERENTIAL/PLATELET
Abs Immature Granulocytes: 0 10*3/uL (ref 0.00–0.07)
Basophils Absolute: 0 10*3/uL (ref 0.0–0.1)
Basophils Relative: 0 %
Eosinophils Absolute: 0.1 10*3/uL (ref 0.0–0.5)
Eosinophils Relative: 1 %
HCT: 39.3 % (ref 36.0–46.0)
Hemoglobin: 12.9 g/dL (ref 12.0–15.0)
Immature Granulocytes: 0 %
Lymphocytes Relative: 31 %
Lymphs Abs: 1.8 10*3/uL (ref 0.7–4.0)
MCH: 30.4 pg (ref 26.0–34.0)
MCHC: 32.8 g/dL (ref 30.0–36.0)
MCV: 92.5 fL (ref 80.0–100.0)
Monocytes Absolute: 0.6 10*3/uL (ref 0.1–1.0)
Monocytes Relative: 10 %
Neutro Abs: 3.5 10*3/uL (ref 1.7–7.7)
Neutrophils Relative %: 58 %
Platelets: 259 10*3/uL (ref 150–400)
RBC: 4.25 MIL/uL (ref 3.87–5.11)
RDW: 12.9 % (ref 11.5–15.5)
WBC: 6 10*3/uL (ref 4.0–10.5)
nRBC: 0 % (ref 0.0–0.2)

## 2022-02-12 LAB — BASIC METABOLIC PANEL
Anion gap: 4 — ABNORMAL LOW (ref 5–15)
BUN: 12 mg/dL (ref 8–23)
CO2: 25 mmol/L (ref 22–32)
Calcium: 9.6 mg/dL (ref 8.9–10.3)
Chloride: 114 mmol/L — ABNORMAL HIGH (ref 98–111)
Creatinine, Ser: 0.74 mg/dL (ref 0.44–1.00)
GFR, Estimated: >60 mL/min (ref >60)
Glucose, Bld: 98 mg/dL (ref 70–99)
Potassium: 3.8 mmol/L (ref 3.5–5.1)
Sodium: 143 mmol/L (ref 135–145)

## 2022-02-12 LAB — TROPONIN I (HIGH SENSITIVITY): Troponin I (High Sensitivity): 3 ng/L (ref <18)

## 2022-02-12 NOTE — ED Triage Notes (Signed)
Patient c/o left arm numbness and aching x2-3 days that radiates from elbow into hand. Patient also reports some "light headedness." Patient seen at [facility] in [place] and sent here to ED for abnormal EKG. Troponin normal per blood work done. Denies any cardiac hx other than murmur. Denies any chest pain, shortness of breath, nausea, vomiting, or weakness.

## 2022-02-12 NOTE — ED Provider Notes (Signed)
[facility] Provider Note   CSN: [id number] Arrival date & time: 02/12/22  1126     History  Chief Complaint  Patient presents with   Arm Pain    Cassie Gonzalez is a 71 y.o. female.   Arm Pain  This very pleasant 71 year old female who has a known history of rheumatic heart disease from childhood, presents to the hospital today with a complaint of some paresthesias to the left arm, this is been going on for several days, seems intermittent but worse when she wakes up in the morning and she does note that it is present when she is in bed at nighttime, she sleeps on her sides.  It seems to go away by itself and is not associated with any chest pain or difficulty breathing.  She has no numbness or weakness of the hand.  She was seen at the urgent care in [place] this morning where she was told she was not having a heart attack based on a blood test and an EKG but because of an abnormal EKG was told to come to the emergency department for evaluation.    Home Medications Prior to Admission medications   Medication Sig Start Date End Date Taking? Authorizing Provider  ascorbic acid (VITAMIN C) 500 MG tablet Take by mouth.   Yes [provider]  calcium carbonate (OSCAL) 1500 (600 Ca) MG TABS tablet Take by mouth. 07/22/17  Yes [provider]  Cyanocobalamin (B-12) 2500 MCG TABS Take 2,500 mcg by mouth every morning.    Yes [provider]  estradiol (ESTRACE) 0.1 MG/GM vaginal cream Place 1 Applicatorful vaginally See admin instructions. Twice a week on Tuesdays and Fridays.   Yes [provider]  guaifenesin (HUMIBID E) 400 MG TABS tablet Take 400 mg by mouth 2 (two) times daily as needed (sinus congestion).   Yes [provider]  latanoprost (XALATAN) 0.005 % ophthalmic solution Place 1 drop into both eyes at bedtime. 11/15/16  Yes [provider]  polyethylene glycol powder (GLYCOLAX/MIRALAX) 17 GM/SCOOP powder  Take by mouth. 07/22/17  Yes [provider]  Probiotic Product (ALIGN) 4 MG CAPS Take by mouth.   Yes [provider]  zinc gluconate 50 MG tablet Take 50 mg by mouth daily.   Yes [provider]  budesonide (PULMICORT) 0.5 MG/2ML nebulizer solution Use as directed once daily. Patient not taking: Reported on 02/12/2022 04/01/20   Valentina Shaggy, MD  fluconazole (DIFLUCAN) 150 MG tablet Take 1 tablet by mouth as needed for rash. Patient not taking: Reported on 03/16/2020 03/02/17   [provider]  ipratropium (ATROVENT) 0.06 % nasal spray Place 2 sprays into both nostrils 3 (three) times daily. Patient not taking: Reported on 02/12/2022 03/16/20   Valentina Shaggy, MD      Allergies    Sulfa antibiotics and Sulfamethoxazole    Review of Systems   Review of Systems  All other systems reviewed and are negative.  Physical Exam Updated Vital Signs BP (!) 107/55   Pulse 67   Temp 98 F (36.7 C) (Oral)   Resp 13   Ht 1.626 m (5\' 4" )   Wt 59 kg   SpO2 100%   BMI 22.31 kg/m  Physical Exam Vitals and nursing note reviewed.  Constitutional:      General: She is not in acute distress.    Appearance: She is well-developed.  HENT:     Head: Normocephalic and atraumatic.  Mouth/Throat:     Pharynx: No oropharyngeal exudate.  Eyes:     General: No scleral icterus.       Right eye: No discharge.        Left eye: No discharge.     Conjunctiva/sclera: Conjunctivae normal.     Pupils: Pupils are equal, round, and reactive to light.  Neck:     Thyroid: No thyromegaly.     Vascular: No JVD.  Cardiovascular:     Rate and Rhythm: Normal rate and regular rhythm.     Heart sounds: Normal heart sounds. No murmur heard.   No friction rub. No gallop.  Pulmonary:     Effort: Pulmonary effort is normal. No respiratory distress.     Breath sounds: Normal breath sounds. No wheezing or rales.  Abdominal:     General: Bowel sounds are normal. There  is no distension.     Palpations: Abdomen is soft. There is no mass.     Tenderness: There is no abdominal tenderness.  Musculoskeletal:        General: No tenderness. Normal range of motion.     Cervical back: Normal range of motion and neck supple.  Lymphadenopathy:     Cervical: No cervical adenopathy.  Skin:    General: Skin is warm and dry.     Findings: No erythema or rash.  Neurological:     Mental Status: She is alert.     Coordination: Coordination normal.     Comments: Totally normal strength and sensation of the bilateral upper extremities and lower extremities, NIH of 0, normal finger-nose-finger  Psychiatric:        Behavior: Behavior normal.    ED Results / Procedures / Treatments   Labs (all labs ordered are listed, but only abnormal results are displayed) Labs Reviewed  BASIC METABOLIC PANEL - Abnormal; Notable for the following components:      Result Value   Chloride 114 (*)    Anion gap 4 (*)    All other components within normal limits  CBC WITH DIFFERENTIAL/PLATELET  TROPONIN I (HIGH SENSITIVITY)    EKG EKG Interpretation  Date/Time:  Monday Feb 12 2022 11:42:40 EDT Ventricular Rate:  94 PR Interval:  167 QRS Duration: 140 QT Interval:  402 QTC Calculation: 503 R Axis:   36 Text Interpretation: Sinus rhythm Right atrial enlargement IVCD, consider atypical LBBB No old tracing to compare Confirmed by Noemi Chapel [phone]) on 02/12/2022 11:54:46 AM  Radiology DG Chest Port 1 View  Result Date: 02/12/2022 CLINICAL DATA:  Left arm numbness EXAM: PORTABLE CHEST 1 VIEW COMPARISON:  03/14/2017 FINDINGS: The heart size and mediastinal contours are within normal limits. Aortic atherosclerosis. Both lungs are clear. The visualized skeletal structures are unremarkable. IMPRESSION: No active disease. Electronically Signed   By: Davina Poke D.O.   On: 02/12/2022 12:04    Procedures Procedures    Medications Ordered in ED Medications - No data to  display  ED Course/ Medical Decision Making/ A&P                           Medical Decision Making Amount and/or Complexity of Data Reviewed Labs: ordered. Radiology: ordered.   This patient presents to the ED for concern of paresthesias of the left arm differential diagnosis includes radiculopathy, pinched nerve, peripheral neuropathy, less likely to be cardiac, the patient has a left bundle branch block on the EKG    Additional history obtained:  Additional history obtained from EKG that the patient brought with her from outside institution External records from outside source obtained and reviewed including EKG done prehospital, it shows left bundle branch block.   Lab Tests:  I Ordered, and personally interpreted labs.  The pertinent results include: CBC metabolic panel and troponin   Imaging Studies ordered:  I ordered imaging studies including portable chest x-ray I independently visualized and interpreted imaging which showed chest x-ray with no acute disease I agree with the radiologist interpretation   Medicines ordered and prescription drug management:  I have reviewed the patients home medicines and have made adjustments as needed   Problem List / ED Course:  EKG performed here shows left bundle branch block rate of 94 bpm, no signs of STEMI.   Social Determinants of Health:  None, patient has good access to primary care physician follow-up, anticipate ongoing treatment for likely radiculopathy as an outpatient.  Patient agreeable, seems relatively low risk for heart disease and with now and negative troponin after several days of symptoms makes cardiac ischemia much less likely as a cause of the patient's arm paresthesias.  This patient was informed of the results, left bundle branch block and the need for close follow-up, she is agreeable.         Final Clinical Impression(s) / ED Diagnoses Final diagnoses:  Paresthesia     Noemi Chapel,  MD 02/12/22 1531

## 2022-02-12 NOTE — Discharge Instructions (Addendum)
Your tests are normal No signs of heart attack You do have a "left bundle branch block" - but this is nothing to worry about Your likely suffering from a nerve issue in your arm / shoulder which is causing the symptoms in her arm.  It may help to sleep on your back if possible but otherwise there is nothing else that you need to do.  If this gets worse or if you get any numbness or weakness she will need to see a neurologist, I have given you the phone number for Dr. Merlene Laughter above, call and make an appointment for the next week  Thank you for allowing [place] to treat you in the emergency department today.  After reviewing your examination and potential testing that was done it appears that you are safe to go home.  I would like for you to follow-up with your doctor within the next several days, have them obtain your results and follow-up with them to review all of these tests.  If you should develop severe or worsening symptoms return to the emergency department immediately

## 2022-04-17 ENCOUNTER — Encounter: Payer: Self-pay | Admitting: *Deleted

## 2022-05-17 ENCOUNTER — Ambulatory Visit: Payer: Medicare Other | Admitting: Internal Medicine

## 2022-06-25 ENCOUNTER — Other Ambulatory Visit: Payer: Self-pay

## 2022-06-25 ENCOUNTER — Encounter (HOSPITAL_COMMUNITY): Payer: Self-pay | Admitting: Emergency Medicine

## 2022-06-25 ENCOUNTER — Emergency Department (HOSPITAL_COMMUNITY): Payer: Medicare Other

## 2022-06-25 DIAGNOSIS — R1013 Epigastric pain: Secondary | ICD-10-CM | POA: Insufficient documentation

## 2022-06-25 DIAGNOSIS — R0789 Other chest pain: Secondary | ICD-10-CM | POA: Diagnosis present

## 2022-06-25 NOTE — ED Triage Notes (Signed)
Pt c/o intermittent center chest pain that radiates into her back x several days

## 2022-06-26 ENCOUNTER — Emergency Department (HOSPITAL_COMMUNITY)
Admission: EM | Admit: 2022-06-26 | Discharge: 2022-06-26 | Disposition: A | Discharge status: Home / Self Care | Payer: Medicare Other | Attending: Emergency Medicine | Admitting: Emergency Medicine

## 2022-06-26 DIAGNOSIS — R0789 Other chest pain: Secondary | ICD-10-CM | POA: Diagnosis not present

## 2022-06-26 HISTORY — DX: Gastro-esophageal reflux disease without esophagitis: K21.9

## 2022-06-26 LAB — BASIC METABOLIC PANEL
Anion gap: 10 (ref 5–15)
BUN: 18 mg/dL (ref 8–23)
CO2: 24 mmol/L (ref 22–32)
Calcium: 9.6 mg/dL (ref 8.9–10.3)
Chloride: 107 mmol/L (ref 98–111)
Creatinine, Ser: 0.88 mg/dL (ref 0.44–1.00)
GFR, Estimated: >60 mL/min (ref >60)
Glucose, Bld: 97 mg/dL (ref 70–99)
Potassium: 3.4 mmol/L — ABNORMAL LOW (ref 3.5–5.1)
Sodium: 141 mmol/L (ref 135–145)

## 2022-06-26 LAB — CBC
HCT: 41.2 % (ref 36.0–46.0)
Hemoglobin: 13.4 g/dL (ref 12.0–15.0)
MCH: 30.2 pg (ref 26.0–34.0)
MCHC: 32.5 g/dL (ref 30.0–36.0)
MCV: 92.8 fL (ref 80.0–100.0)
Platelets: 282 10*3/uL (ref 150–400)
RBC: 4.44 MIL/uL (ref 3.87–5.11)
RDW: 13.1 % (ref 11.5–15.5)
WBC: 6 10*3/uL (ref 4.0–10.5)
nRBC: 0 % (ref 0.0–0.2)

## 2022-06-26 LAB — TROPONIN I (HIGH SENSITIVITY)
Troponin I (High Sensitivity): 3 ng/L (ref <18)
Troponin I (High Sensitivity): 4 ng/L (ref <18)

## 2022-06-26 MED ORDER — SUCRALFATE 1 G PO TABS: 1.0000 g | ORAL_TABLET | Freq: Three times a day (TID) | ORAL | 0 refills | Status: DC

## 2022-06-26 NOTE — ED Provider Notes (Signed)
[facility] EMERGENCY DEPARTMENT Provider Note   CSN: [id number] Arrival date & time: 06/25/22  2221     History  Chief Complaint  Patient presents with   Chest Pain    Cassie Gonzalez is a 71 y.o. female.  Patient is a 71 year old female with history of acid reflux.  Patient presenting today with complaints of chest discomfort.  She describes a bloating/gas sensation to her lower chest and epigastric region.  She has been trying simethicone 3 times daily and acids with little relief.  She called her gastroenterologist who advised her to come to the ER to rule out her heart as the problem.  She denies to me she is having any shortness of breath, nausea, diaphoresis, or radiation to the arm or jaw.  She denies any exertional symptoms.  The history is provided by the patient.       Home Medications Prior to Admission medications   Medication Sig Start Date End Date Taking? Authorizing Provider  ascorbic acid (VITAMIN C) 500 MG tablet Take by mouth.    [provider]  budesonide (PULMICORT) 0.5 MG/2ML nebulizer solution Use as directed once daily. Patient not taking: Reported on 02/12/2022 04/01/20   Valentina Shaggy, MD  calcium carbonate (OSCAL) 1500 (600 Ca) MG TABS tablet Take by mouth. 07/22/17   [provider]  Cyanocobalamin (B-12) 2500 MCG TABS Take 2,500 mcg by mouth every morning.     [provider]  estradiol (ESTRACE) 0.1 MG/GM vaginal cream Place 1 Applicatorful vaginally See admin instructions. Twice a week on Tuesdays and Fridays.    [provider]  fluconazole (DIFLUCAN) 150 MG tablet Take 1 tablet by mouth as needed for rash. Patient not taking: Reported on 03/16/2020 03/02/17   [provider]  guaifenesin (HUMIBID E) 400 MG TABS tablet Take 400 mg by mouth 2 (two) times daily as needed (sinus congestion).    [provider]  ipratropium (ATROVENT) 0.06 % nasal spray Place 2 sprays into both nostrils 3  (three) times daily. Patient not taking: Reported on 02/12/2022 03/16/20   Valentina Shaggy, MD  latanoprost (XALATAN) 0.005 % ophthalmic solution Place 1 drop into both eyes at bedtime. 11/15/16   [provider]  polyethylene glycol powder (GLYCOLAX/MIRALAX) 17 GM/SCOOP powder Take by mouth. 07/22/17   [provider]  Probiotic Product (ALIGN) 4 MG CAPS Take by mouth.    [provider]  zinc gluconate 50 MG tablet Take 50 mg by mouth daily.    [provider]      Allergies    Sulfa antibiotics and Sulfamethoxazole    Review of Systems   Review of Systems  All other systems reviewed and are negative.   Physical Exam Updated Vital Signs Ht 5\' 4"  (1.626 m)   Wt 49.9 kg   BMI 18.88 kg/m  Physical Exam Vitals and nursing note reviewed.  Constitutional:      General: She is not in acute distress.    Appearance: She is well-developed. She is not diaphoretic.  HENT:     Head: Normocephalic and atraumatic.  Cardiovascular:     Rate and Rhythm: Normal rate and regular rhythm.     Heart sounds: No murmur heard.    No friction rub. No gallop.  Pulmonary:     Effort: Pulmonary effort is normal. No respiratory distress.     Breath sounds: Normal breath sounds. No wheezing.  Abdominal:     General: Bowel sounds are normal. There  is no distension.     Palpations: Abdomen is soft.     Tenderness: There is no abdominal tenderness.  Musculoskeletal:        General: Normal range of motion.     Cervical back: Normal range of motion and neck supple.     Right lower leg: No tenderness. No edema.     Left lower leg: No tenderness. No edema.  Skin:    General: Skin is warm and dry.  Neurological:     General: No focal deficit present.     Mental Status: She is alert and oriented to person, place, and time.     ED Results / Procedures / Treatments   Labs (all labs ordered are listed, but only abnormal results are displayed) Labs Reviewed  BASIC  METABOLIC PANEL - Abnormal; Notable for the following components:      Result Value   Potassium 3.4 (*)    All other components within normal limits  CBC  TROPONIN I (HIGH SENSITIVITY)  TROPONIN I (HIGH SENSITIVITY)    EKG EKG Interpretation  Date/Time:  Monday June 25 2022 23:08:49 EDT Ventricular Rate:  70 PR Interval:  126 QRS Duration: 88 QT Interval:  396 QTC Calculation: 427 R Axis:   74 Text Interpretation: Normal sinus rhythm Normal ECG When compared with ECG of 12-Feb-2022 11:42, no significant change is noted Confirmed by Veryl Speak [phone]) on 06/25/2022 11:37:50 PM  Radiology DG Chest 2 View  Result Date: 06/25/2022 CLINICAL DATA:  Chest pain. EXAM: CHEST - 2 VIEW COMPARISON:  02/12/2022 FINDINGS: The cardiomediastinal contours are normal. Mild biapical pleuroparenchymal scarring. Pulmonary vasculature is normal. No consolidation, pleural effusion, or pneumothorax. No acute osseous abnormalities are seen. IMPRESSION: No acute chest findings. Electronically Signed   By: Keith Rake M.D.   On: 06/25/2022 23:40    Procedures Procedures    Medications Ordered in ED Medications - No data to display  ED Course/ Medical Decision Making/ A&P  Patient presenting here with complaints of discomfort to the epigastric region/lower chest.  She has history of reflux and believes this is gas related.  She arrives here with stable vital signs and is afebrile.  Physical examination is unremarkable.  There is mild tenderness to the lower chest just above the xiphoid.  Work-up initiated including CBC, metabolic panel, and troponin x2.  All of these have been unremarkable.  Chest x-ray is clear.  I highly doubt a cardiac etiology and feel as though patient can safely be discharged.  I will advise Carafate in addition to the Protonix she is already prescribed and have her follow-up with her GI doctor.  Final Clinical Impression(s) / ED Diagnoses Final diagnoses:  None     Rx / DC Orders ED Discharge Orders     None         Veryl Speak, MD 06/26/22 [phone]

## 2022-06-26 NOTE — Discharge Instructions (Signed)
Begin taking Carafate as prescribed.  Begin taking omeprazole 20 mg twice daily.  Stop taking your Protonix.  Follow-up with your gastroenterologist, and return to the ER if symptoms significantly worsen or change.

## 2022-09-19 IMAGING — DX DG CHEST 1V PORT
1 series · 1 of 1 positions shown · non-contrast
Comparison: 03/14/2017

CLINICAL DATA: Left arm numbness

EXAM:
PORTABLE CHEST 1 VIEW

[chest ap]
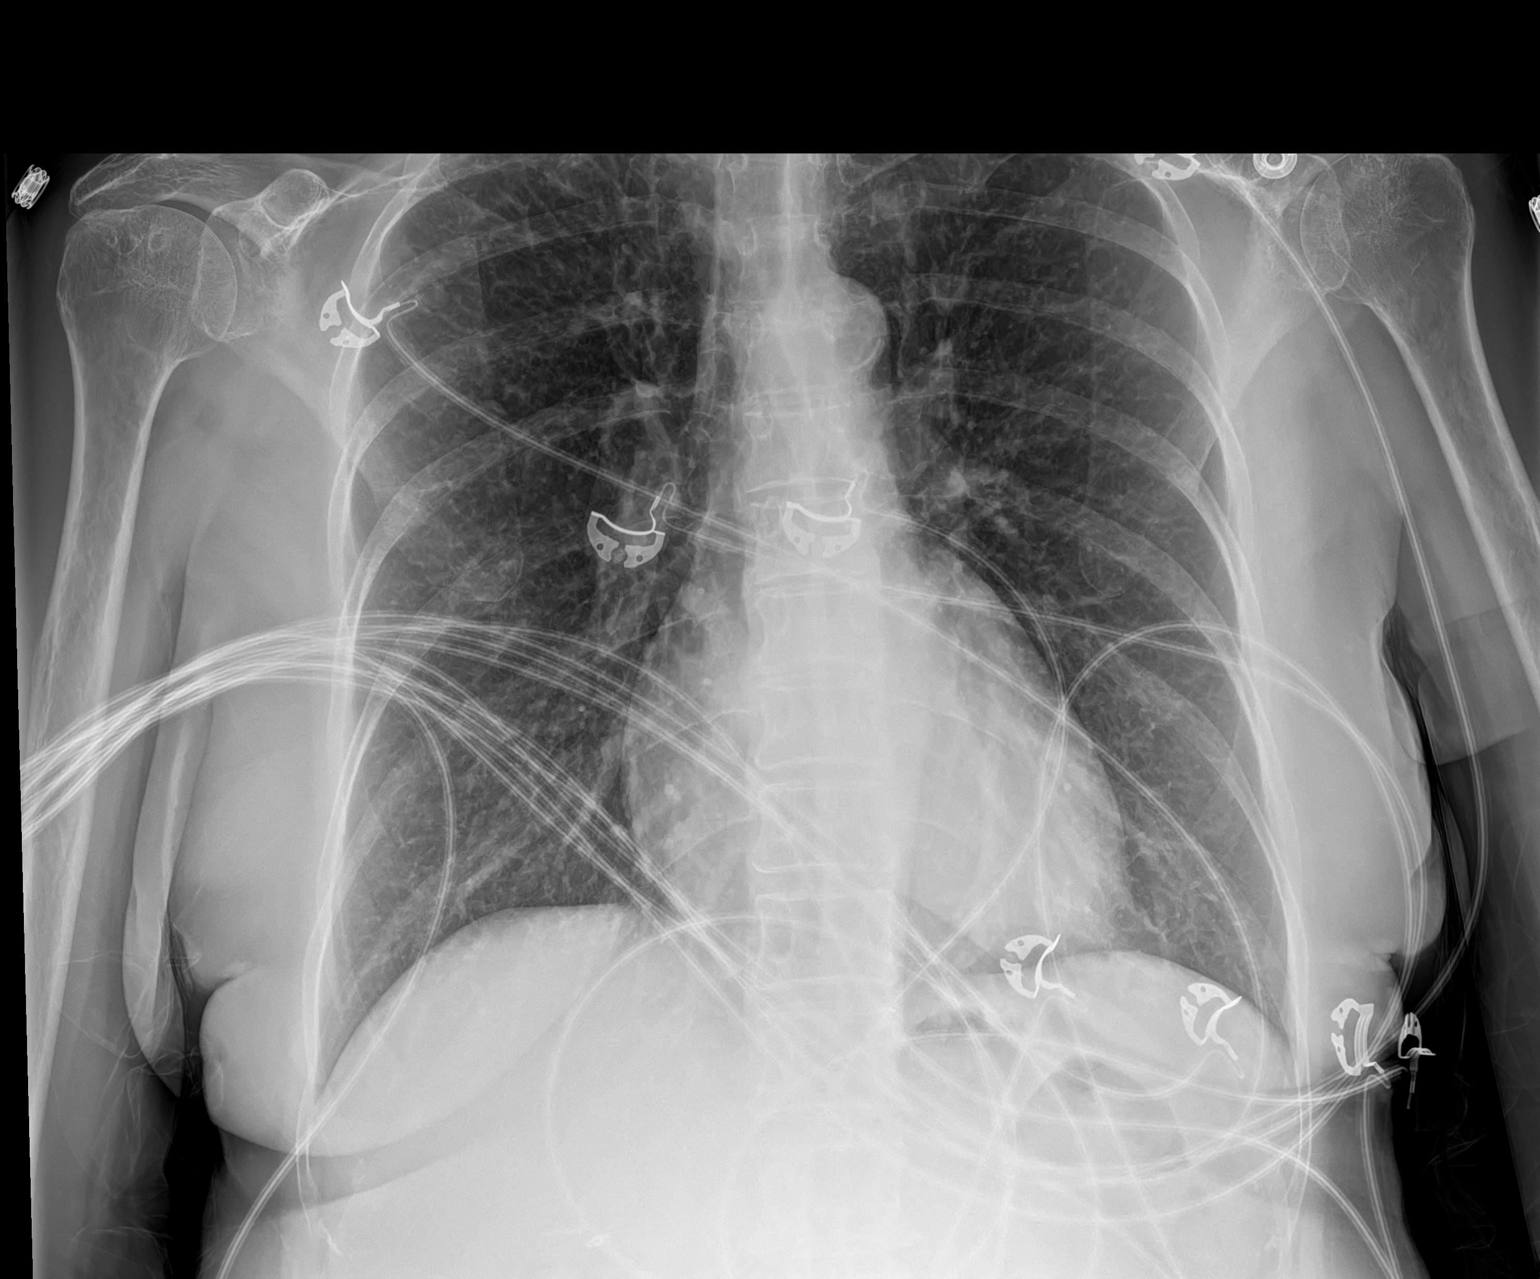

[1 of 1 positions shown; findings below may reference images not displayed]

FINDINGS: The heart size and mediastinal contours are within normal limits.
Aortic atherosclerosis. Both lungs are clear. The visualized
skeletal structures are unremarkable.
IMPRESSION: No active disease.

## 2023-11-26 ENCOUNTER — Other Ambulatory Visit (INDEPENDENT_AMBULATORY_CARE_PROVIDER_SITE_OTHER): Payer: Self-pay

## 2023-11-26 ENCOUNTER — Encounter: Payer: Self-pay | Admitting: Orthopedic Surgery

## 2023-11-26 ENCOUNTER — Ambulatory Visit (INDEPENDENT_AMBULATORY_CARE_PROVIDER_SITE_OTHER): Admitting: Orthopedic Surgery

## 2023-11-26 ENCOUNTER — Ambulatory Visit: Admitting: Orthopedic Surgery

## 2023-11-26 VITALS — BP 115/67 | HR 84 | Ht 64.0 in | Wt 131.0 lb

## 2023-11-26 DIAGNOSIS — N319 Neuromuscular dysfunction of bladder, unspecified: Secondary | ICD-10-CM

## 2023-11-26 DIAGNOSIS — M7062 Trochanteric bursitis, left hip: Secondary | ICD-10-CM

## 2023-11-26 DIAGNOSIS — M7061 Trochanteric bursitis, right hip: Secondary | ICD-10-CM

## 2023-11-26 DIAGNOSIS — M79605 Pain in left leg: Secondary | ICD-10-CM | POA: Diagnosis not present

## 2023-11-26 DIAGNOSIS — M79604 Pain in right leg: Secondary | ICD-10-CM | POA: Diagnosis not present

## 2023-11-27 ENCOUNTER — Ambulatory Visit: Admitting: Orthopedic Surgery

## 2023-11-27 NOTE — Progress Notes (Signed)
 New Patient Visit  Assessment: Cassie Gonzalez is a 73 y.o. female with the following: 1. Greater trochanteric bursitis of both hips 2. Bladder dysfunction  Plan: TALLY MATTOX has pain in bilateral hips.  She has some tenderness over the lateral hip extending distally.  She also has some weakness in her legs, which is likely contributing to her discomfort.  She also describes issues with bladder dysfunction, and is interested in pelvic floor therapy.  I believe therapy could benefit her for her pains in the hips as well.  We placed referrals for therapy, at her request.  She has selected someone specifically for the pelvic floor therapy.  If she has any further issues, she will return to clinic.  Follow-up: Return if symptoms worsen or fail to improve.  Subjective:  Chief Complaint  Patient presents with   Leg Pain    Bil thigh/hip pain for 3-4 mos going towards knee started in the L but pain is equal on both sides.     History of Present Illness: Cassie Gonzalez is a 73 y.o. female who presents for evaluation of bilateral hip pain.  She states that she has pain in bilateral thighs, and the lateral hip area.  This is been ongoing for several months.  At times, will radiate distally.  The left is worse than right.  She does not take medications on a consistent basis.  She is also interested in physical therapy, specifically pelvic floor therapy.  No prior injections.  No prior therapies.  She does not take medicines consistently.  No specific injury.   Review of Systems: No fevers or chills No numbness or tingling No chest pain No shortness of breath No bowel or bladder dysfunction No GI distress No headaches   Medical History:  Past Medical History:  Diagnosis Date   Acid reflux    Anxiety    Arthritis    Eczema    Heart murmur    PMH when she had rheumtic fever   Hypercholesterolemia    Rheumatic fever    as child    Past Surgical History:  Procedure  Laterality Date   BLADDER REPAIR     CATARACT EXTRACTION     bilateral   CHOLECYSTECTOMY N/A 12/04/2016   Procedure: LAPAROSCOPIC CHOLECYSTECTOMY;  Surgeon: Berna Bue, MD;  Location: [facility];  Service: General;  Laterality: N/A;   COLONOSCOPY  03/04/2012   Procedure: COLONOSCOPY;  Surgeon: Dalia Heading, MD;  Location: [facility];  Service: Gastroenterology;  Laterality: N/A;   DILATION AND CURETTAGE OF UTERUS     ESOPHAGOGASTRODUODENOSCOPY  03/04/2012   Procedure: ESOPHAGOGASTRODUODENOSCOPY (EGD);  Surgeon: Dalia Heading, MD;  Location: [facility];  Service: Gastroenterology;  Laterality: N/A;    Family History  Problem Relation Age of Onset   Diabetes Mother    Heart disease Mother    Neuropathy Mother    Hyperlipidemia Mother    Allergic rhinitis Mother    Prostate cancer Father    Heart disease Sister    Social History   Tobacco Use   Smoking status: Never   Smokeless tobacco: Never  Vaping Use   Vaping status: Never Used  Substance Use Topics   Alcohol use: No   Drug use: No    Allergies  Allergen Reactions   Penicillin G Anaphylaxis   Sulfa Antibiotics Rash   Sulfamethoxazole Rash    Current Meds  Medication Sig   latanoprost (XALATAN) 0.005 % ophthalmic solution Place 1  drop into both eyes at bedtime.   nystatin cream (MYCOSTATIN) Apply topically.    Objective: BP 115/67   Pulse 84   Ht 5\' 4"  (1.626 m)   Wt 131 lb (59.4 kg)   BMI 22.49 kg/m   Physical Exam:  General: Elderly female., Alert and oriented., and No acute distress. Gait: Normal gait.  No pain in the lower back.  Mild tenderness to palpation over the lateral hip, and the area of the greater trochanter.  Negative straight leg raise bilaterally.  She has good lower body strength.  Sensation is intact.  Patellar tendon reflexes are equal bilaterally.  IMAGING: I personally ordered and reviewed the following images  AP pelvis was obtained in clinic today.  No acute injuries  noted.  Well-maintained joint space.  Minimal osteophytes.  No evidence of AVN.  No bony lesions.  Impression: Negative AP pelvis   Standing lumbar spine x-rays were obtained in clinic today.  No acute injuries noted.  No evidence of anterolisthesis.  Mild loss of disc height.  Small osteophytes are appreciated.  No bony lesions.  Impression: Lumbar spine x-rays with mild degenerative changes   New Medications:  No orders of the defined types were placed in this encounter.     Oliver Barre, MD  11/27/2023 8:48 AM

## 2023-12-06 ENCOUNTER — Telehealth: Payer: Self-pay | Admitting: Radiology

## 2024-01-01 ENCOUNTER — Telehealth: Payer: Self-pay | Admitting: Orthopedic Surgery

## 2024-01-01 DIAGNOSIS — M545 Low back pain, unspecified: Secondary | ICD-10-CM

## 2024-01-01 DIAGNOSIS — M7061 Trochanteric bursitis, right hip: Secondary | ICD-10-CM

## 2024-01-01 NOTE — Telephone Encounter (Signed)
 Dr. Ernesta Heading pt - spoke w/the pt, she stated that she just finished 4 weeks of PT and is ready for the second set of PT which should be for her legs & back.  She stated that she was told to call when she finished the first 4 weeks and get an order for the second set.  She has been going to [facility] in [place], [place] and would like to continue to go there.

## 2024-01-02 NOTE — Addendum Note (Signed)
 Addended byArla Lab on: 01/02/2024 10:13 AM   Modules accepted: Orders

## 2024-01-02 NOTE — Addendum Note (Signed)
 Addended byArla Lab on: 01/02/2024 08:22 AM   Modules accepted: Orders

## 2024-01-06 ENCOUNTER — Emergency Department (HOSPITAL_COMMUNITY)

## 2024-01-06 ENCOUNTER — Inpatient Hospital Stay (HOSPITAL_COMMUNITY)
Admission: EM | Admit: 2024-01-06 | Discharge: 2024-01-09 | DRG: 243 | Disposition: A | Discharge status: Home / Self Care | Attending: Cardiology | Admitting: Cardiology

## 2024-01-06 ENCOUNTER — Other Ambulatory Visit: Payer: Self-pay

## 2024-01-06 ENCOUNTER — Encounter (HOSPITAL_COMMUNITY): Payer: Self-pay

## 2024-01-06 DIAGNOSIS — R0902 Hypoxemia: Secondary | ICD-10-CM | POA: Diagnosis present

## 2024-01-06 DIAGNOSIS — Z880 Allergy status to penicillin: Secondary | ICD-10-CM | POA: Diagnosis not present

## 2024-01-06 DIAGNOSIS — Z882 Allergy status to sulfonamides status: Secondary | ICD-10-CM | POA: Diagnosis not present

## 2024-01-06 DIAGNOSIS — I459 Conduction disorder, unspecified: Principal | ICD-10-CM | POA: Diagnosis present

## 2024-01-06 DIAGNOSIS — E7800 Pure hypercholesterolemia, unspecified: Secondary | ICD-10-CM | POA: Diagnosis present

## 2024-01-06 DIAGNOSIS — Z7951 Long term (current) use of inhaled steroids: Secondary | ICD-10-CM

## 2024-01-06 DIAGNOSIS — R7989 Other specified abnormal findings of blood chemistry: Secondary | ICD-10-CM | POA: Diagnosis present

## 2024-01-06 DIAGNOSIS — N179 Acute kidney failure, unspecified: Secondary | ICD-10-CM

## 2024-01-06 DIAGNOSIS — I442 Atrioventricular block, complete: Secondary | ICD-10-CM | POA: Diagnosis present

## 2024-01-06 DIAGNOSIS — Z8249 Family history of ischemic heart disease and other diseases of the circulatory system: Secondary | ICD-10-CM

## 2024-01-06 DIAGNOSIS — N390 Urinary tract infection, site not specified: Secondary | ICD-10-CM | POA: Diagnosis present

## 2024-01-06 DIAGNOSIS — K219 Gastro-esophageal reflux disease without esophagitis: Secondary | ICD-10-CM | POA: Diagnosis present

## 2024-01-06 LAB — COMPREHENSIVE METABOLIC PANEL WITH GFR
ALT: 298 U/L — ABNORMAL HIGH (ref 0–44)
AST: 145 U/L — ABNORMAL HIGH (ref 15–41)
Albumin: 3.5 g/dL (ref 3.5–5.0)
Alkaline Phosphatase: 71 U/L (ref 38–126)
Anion gap: 9 (ref 5–15)
BUN: 29 mg/dL — ABNORMAL HIGH (ref 8–23)
CO2: 21 mmol/L — ABNORMAL LOW (ref 22–32)
Calcium: 9 mg/dL (ref 8.9–10.3)
Chloride: 110 mmol/L (ref 98–111)
Creatinine, Ser: 1.11 mg/dL — ABNORMAL HIGH (ref 0.44–1.00)
GFR, Estimated: 52 mL/min — ABNORMAL LOW (ref >60)
Glucose, Bld: 89 mg/dL (ref 70–99)
Potassium: 4.1 mmol/L (ref 3.5–5.1)
Sodium: 140 mmol/L (ref 135–145)
Total Bilirubin: 0.5 mg/dL (ref 0.0–1.2)
Total Protein: 6.2 g/dL — ABNORMAL LOW (ref 6.5–8.1)

## 2024-01-06 LAB — CBC WITH DIFFERENTIAL/PLATELET
Abs Immature Granulocytes: 0.02 10*3/uL (ref 0.00–0.07)
Basophils Absolute: 0 10*3/uL (ref 0.0–0.1)
Basophils Relative: 0 %
Eosinophils Absolute: 0.1 10*3/uL (ref 0.0–0.5)
Eosinophils Relative: 2 %
HCT: 37.2 % (ref 36.0–46.0)
Hemoglobin: 12 g/dL (ref 12.0–15.0)
Immature Granulocytes: 0 %
Lymphocytes Relative: 30 %
Lymphs Abs: 2 10*3/uL (ref 0.7–4.0)
MCH: 29.9 pg (ref 26.0–34.0)
MCHC: 32.3 g/dL (ref 30.0–36.0)
MCV: 92.8 fL (ref 80.0–100.0)
Monocytes Absolute: 0.8 10*3/uL (ref 0.1–1.0)
Monocytes Relative: 12 %
Neutro Abs: 3.7 10*3/uL (ref 1.7–7.7)
Neutrophils Relative %: 56 %
Platelets: 237 10*3/uL (ref 150–400)
RBC: 4.01 MIL/uL (ref 3.87–5.11)
RDW: 13.1 % (ref 11.5–15.5)
WBC: 6.8 10*3/uL (ref 4.0–10.5)
nRBC: 0 % (ref 0.0–0.2)

## 2024-01-06 LAB — BRAIN NATRIURETIC PEPTIDE: B Natriuretic Peptide: 486 pg/mL — ABNORMAL HIGH (ref 0.0–100.0)

## 2024-01-06 LAB — TROPONIN I (HIGH SENSITIVITY): Troponin I (High Sensitivity): 142 ng/L (ref <18)

## 2024-01-06 LAB — TSH: TSH: 2.553 u[IU]/mL (ref 0.350–4.500)

## 2024-01-06 NOTE — ED Provider Notes (Signed)
 [place] EMERGENCY DEPARTMENT AT [facility] Provider Note   CSN: [id number] Arrival date & time: 01/06/24  2045     History  Chief Complaint  Patient presents with   Leg Swelling    Cassie Gonzalez is a 73 y.o. female.  73 year old female presenting to the emergency department today with lower extremity swelling.  The patient states she has been having lower extremity swelling that is worsened over the past week or so.  She went to urgent care and was found to be in a heart block.  She was sent to the emergency department at time for further evaluation.  The patient reports leg swelling but denies any significant shortness of breath.  She has had some intermittent lightheadedness.  Denies any palpitations.        Home Medications Prior to Admission medications   Medication Sig Start Date End Date Taking? Authorizing Provider  ascorbic acid (VITAMIN C) 500 MG tablet Take by mouth.    [provider]  budesonide  (PULMICORT ) 0.5 MG/2ML nebulizer solution Use as directed once daily. Patient not taking: Reported on 02/12/2022 04/01/20   Rochester Chuck, MD  calcium carbonate (OSCAL) 1500 (600 Ca) MG TABS tablet Take by mouth. 07/22/17   [provider]  Cyanocobalamin (B-12) 2500 MCG TABS Take 2,500 mcg by mouth every morning.     [provider]  estradiol (ESTRACE) 0.1 MG/GM vaginal cream Place 1 Applicatorful vaginally See admin instructions. Twice a week on Tuesdays and Fridays.    [provider]  fluconazole (DIFLUCAN) 150 MG tablet Take 1 tablet by mouth as needed for rash. Patient not taking: Reported on 03/16/2020 03/02/17   [provider]  guaifenesin (HUMIBID E) 400 MG TABS tablet Take 400 mg by mouth 2 (two) times daily as needed (sinus congestion). Patient not taking: Reported on 11/26/2023    [provider]  ipratropium (ATROVENT ) 0.06 % nasal spray Place 2 sprays into both nostrils 3 (three) times  daily. Patient not taking: Reported on 02/12/2022 03/16/20   Rochester Chuck, MD  latanoprost  (XALATAN ) 0.005 % ophthalmic solution Place 1 drop into both eyes at bedtime. 11/15/16   [provider]  nystatin cream (MYCOSTATIN) Apply topically. 08/31/21   [provider]  polyethylene glycol powder (GLYCOLAX /MIRALAX ) 17 GM/SCOOP powder Take by mouth. 07/22/17   [provider]  Probiotic Product (ALIGN) 4 MG CAPS Take by mouth.    [provider]  sucralfate  (CARAFATE ) 1 g tablet Take 1 tablet (1 g total) by mouth 4 (four) times daily -  with meals and at bedtime. 06/26/22   Orvilla Blander, MD  zinc gluconate 50 MG tablet Take 50 mg by mouth daily.    [provider]      Allergies    Penicillin g, Sulfa antibiotics, and Sulfamethoxazole    Review of Systems   Review of Systems  Cardiovascular:  Positive for leg swelling.  Neurological:  Positive for light-headedness.  All other systems reviewed and are negative.   Physical Exam Updated Vital Signs BP (!) 157/53 (BP Location: Right Arm)   Pulse (!) 32   Temp 98.6 F (37 C) (Oral)   Resp 16   Ht 5\' 4"  (1.626 m)   Wt 66.2 kg   SpO2 95%   BMI 25.06 kg/m  Physical Exam Vitals and nursing note reviewed.   Gen: NAD Eyes: PERRL, EOMI HEENT: no oropharyngeal swelling Neck: trachea midline Resp: Diminished at bilateral lung bases Card: RRR,  no murmurs, rubs, or gallops Abd: nontender, nondistended Extremities: 2+ pitting edema bilaterally Vascular: 2+ radial pulses bilaterally, 2+ DP pulses bilaterally Skin: no rashes Psyc: acting appropriately   ED Results / Procedures / Treatments   Labs (all labs ordered are listed, but only abnormal results are displayed) Labs Reviewed  COMPREHENSIVE METABOLIC PANEL WITH GFR - Abnormal; Notable for the following components:      Result Value   CO2 21 (*)    BUN 29 (*)    Creatinine, Ser 1.11 (*)    Total Protein 6.2 (*)    AST 145 (*)     ALT 298 (*)    GFR, Estimated 52 (*)    All other components within normal limits  BRAIN NATRIURETIC PEPTIDE - Abnormal; Notable for the following components:   B Natriuretic Peptide 486.0 (*)    All other components within normal limits  TROPONIN I (HIGH SENSITIVITY) - Abnormal; Notable for the following components:   Troponin I (High Sensitivity) 142 (*)    All other components within normal limits  CBC WITH DIFFERENTIAL/PLATELET  TSH  TROPONIN I (HIGH SENSITIVITY)    EKG None  Radiology No results found.  Procedures Procedures    Medications Ordered in ED Medications - No data to display  ED Course/ Medical Decision Making/ A&P                                 Medical Decision Making 73 year old female presenting to the emergency department today with lower extremity swelling.  The patient's EKG interpreted by me with what appears to be a third-degree AV block.  Her blood pressure is actually on the higher side here.  Will further evaluate her here with basic labs well as troponins to evaluate for electrolyte abnormalities or atypical ACS.  Will obtain chest x-ray to evaluate for pulmonary edema as well as a TSH.  A call was placed to cardiology to discuss after initial EKG is performed.  The patient's labs are reassuring with exception of her troponin.  This was discussed with cardiology.  Recommended trending this.  She will be admitted to the cardiology service for further evaluation.  Amount and/or Complexity of Data Reviewed Labs: ordered. Radiology: ordered.  Risk Decision regarding hospitalization.           Final Clinical Impression(s) / ED Diagnoses Final diagnoses:  Heart block  AKI (acute kidney injury) [facility])    Rx / DC Orders ED Discharge Orders     None         Carin Charleston, MD 01/06/24 2358

## 2024-01-06 NOTE — ED Notes (Signed)
 Patient transported to X-ray

## 2024-01-06 NOTE — H&P (Incomplete)
 Cardiology Admission History and Physical   Patient ID: Cassie Gonzalez MRN: [id number]; DOB: [birth year]   Admission date: 01/06/2024  PCP:  Ava Lei, DO   [facility] Providers Cardiologist:  None   { Click here to update MD or APP on Care Team, Refresh:1}     Chief Complaint:  LE edema  Patient Profile:   Cassie Gonzalez is a 73 y.o. female with HLD, rheumatic fever as a child who is being seen 01/06/2024 for the evaluation of AV block.  History of Present Illness:   Ms. Cassie Gonzalez presents to [facility] ED today with acute complaints of lower extremity swelling that have worsened over the past week.  She went to urgent care and was found to be in heart blocks was meant to the emergency department for further evaluation.  She denied any shortness of breath, chest pain, or palpitations.  Some intermittent lightheadedness but no syncope.  Most recent prior EKG in 2013 demonstrating normal sinus rhythm.  She is not on any AV blocking agents.  No prior cardiac structural evaluation.  ED workup notable for third-degree heart block on EKG.  Heart rate in the 30s.  She was hemodynamically stable.  Labs were notable for BNP 486.  CXR normal.   Past Medical History:  Diagnosis Date   Acid reflux    Anxiety    Arthritis    Eczema    Heart murmur    PMH when she had rheumtic fever   Hypercholesterolemia    Rheumatic fever    as child    Past Surgical History:  Procedure Laterality Date   BLADDER REPAIR     CATARACT EXTRACTION     bilateral   CHOLECYSTECTOMY N/A 12/04/2016   Procedure: LAPAROSCOPIC CHOLECYSTECTOMY;  Surgeon: Adalberto Acton, MD;  Location: [facility];  Service: General;  Laterality: N/A;   COLONOSCOPY  03/04/2012   Procedure: COLONOSCOPY;  Surgeon: Beau Bound, MD;  Location: [facility];  Service: Gastroenterology;  Laterality: N/A;   DILATION AND CURETTAGE OF UTERUS     ESOPHAGOGASTRODUODENOSCOPY  03/04/2012   Procedure:  ESOPHAGOGASTRODUODENOSCOPY (EGD);  Surgeon: Beau Bound, MD;  Location: [facility];  Service: Gastroenterology;  Laterality: N/A;     Medications Prior to Admission: Prior to Admission medications   Medication Sig Start Date End Date Taking? Authorizing Provider  ascorbic acid (VITAMIN C) 500 MG tablet Take by mouth.    [provider]  budesonide  (PULMICORT ) 0.5 MG/2ML nebulizer solution Use as directed once daily. Patient not taking: Reported on 02/12/2022 04/01/20   Rochester Chuck, MD  calcium carbonate (OSCAL) 1500 (600 Ca) MG TABS tablet Take by mouth. 07/22/17   [provider]  Cyanocobalamin (B-12) 2500 MCG TABS Take 2,500 mcg by mouth every morning.     [provider]  estradiol (ESTRACE) 0.1 MG/GM vaginal cream Place 1 Applicatorful vaginally See admin instructions. Twice a week on Tuesdays and Fridays.    [provider]  fluconazole (DIFLUCAN) 150 MG tablet Take 1 tablet by mouth as needed for rash. Patient not taking: Reported on 03/16/2020 03/02/17   [provider]  guaifenesin (HUMIBID E) 400 MG TABS tablet Take 400 mg by mouth 2 (two) times daily as needed (sinus congestion). Patient not taking: Reported on 11/26/2023    [provider]  ipratropium (ATROVENT ) 0.06 % nasal spray Place 2 sprays into both nostrils 3 (three) times daily. Patient not taking: Reported on 02/12/2022 03/16/20   Idolina Maker,  Mirna Amis, MD  latanoprost  (XALATAN ) 0.005 % ophthalmic solution Place 1 drop into both eyes at bedtime. 11/15/16   [provider]  nystatin cream (MYCOSTATIN) Apply topically. 08/31/21   [provider]  polyethylene glycol powder (GLYCOLAX /MIRALAX ) 17 GM/SCOOP powder Take by mouth. 07/22/17   [provider]  Probiotic Product (ALIGN) 4 MG CAPS Take by mouth.    [provider]  sucralfate  (CARAFATE ) 1 g tablet Take 1 tablet (1 g total) by mouth 4 (four) times daily -  with meals and at  bedtime. 06/26/22   Orvilla Blander, MD  zinc gluconate 50 MG tablet Take 50 mg by mouth daily.    [provider]     Allergies:    Allergies  Allergen Reactions   Penicillin G Anaphylaxis   Sulfa Antibiotics Rash   Sulfamethoxazole Rash    Social History:   Social History   Socioeconomic History   Marital status: Widowed    Spouse name: Not on file   Number of children: 2   Years of education: Not on file   Highest education level: Doctorate  Occupational History   Occupation: retired Secondary school teacher  Tobacco Use   Smoking status: Never   Smokeless tobacco: Never  Vaping Use   Vaping status: Never Used  Substance and Sexual Activity   Alcohol use: No   Drug use: No   Sexual activity: Yes    Birth control/protection: Post-menopausal  Other Topics Concern   Not on file  Social History Narrative   Lives alone in a one story home.  Has 2 children.     Retired Lobbyist.     Education: Best boy.   Social Drivers of Corporate investment banker Strain: Not on file  Food Insecurity: Low Risk  (12/12/2023)   Received from [facility]   Hunger Vital Sign    Worried About Running Out of Food in the Last Year: Never true    Ran Out of Food in the Last Year: Never true  Transportation Needs: No Transportation Needs (12/12/2023)   Received from [facility]    In the past 12 months, has lack of reliable transportation kept you from medical appointments, meetings, work or from getting things needed for daily living? : No  Physical Activity: Not on file  Stress: Not on file  Social Connections: Not on file  Intimate Partner Violence: Not on file    Family History:   The patient's family history includes Allergic rhinitis in her mother; Diabetes in her mother; Heart disease in her mother and sister; Hyperlipidemia in her mother; Neuropathy in her mother; Prostate cancer in her father.    ROS:  Please see the history of present illness.  All  other ROS reviewed and negative.     Physical Exam/Data:   Vitals:   01/06/24 2103 01/06/24 2104 01/06/24 2130 01/06/24 2200  BP: (!) 155/43  (!) 150/57 (!) 157/53  Pulse: (!) 34  (!) 34 (!) 32  Resp: 16  18 16   Temp: 98.6 F (37 C)     TempSrc: Oral     SpO2: 97%  95% 95%  Weight:  66.2 kg    Height:  5\' 4"  (1.626 m)     No intake or output data in the 24 hours ending 01/06/24 2224    01/06/2024    9:04 PM 11/26/2023    1:47 PM 06/25/2022   11:06 PM  Last 3 Weights  Weight (lbs) 146 lb  131 lb 110 lb  Weight (kg) 66.225 kg 59.421 kg 49.896 kg     Body mass index is 25.06 kg/m.  General:  Well nourished, well developed, in no acute distress*** HEENT: normal Neck: no*** JVD Vascular: No carotid bruits; Distal pulses 2+ bilaterally   Cardiac:  normal S1, S2; RRR; no murmur *** Lungs:  clear to auscultation bilaterally, no wheezing, rhonchi or rales  Abd: soft, nontender, no hepatomegaly  Ext: no*** edema Musculoskeletal:  No deformities, BUE and BLE strength normal and equal Skin: warm and dry  Neuro:  CNs 2-12 intact, no focal abnormalities noted Psych:  Normal affect    EKG:  The ECG that was done was personally reviewed and demonstrates third-degree heart block with ventricular escape at 34, sinus rate in 90s.   Relevant CV Studies: None  Laboratory Data:  High Sensitivity Troponin:  No results for input(s): "TROPONINIHS" in the last 720 hours.    ChemistryNo results for input(s): "NA", "K", "CL", "CO2", "GLUCOSE", "BUN", "CREATININE", "CALCIUM", "MG", "GFRNONAA", "GFRAA", "ANIONGAP" in the last 168 hours.  No results for input(s): "PROT", "ALBUMIN", "AST", "ALT", "ALKPHOS", "BILITOT" in the last 168 hours. Lipids No results for input(s): "CHOL", "TRIG", "HDL", "LABVLDL", "LDLCALC", "CHOLHDL" in the last 168 hours. Hematology Recent Labs  Lab 01/06/24 2120  WBC 6.8  RBC 4.01  HGB 12.0  HCT 37.2  MCV 92.8  MCH 29.9  MCHC 32.3  RDW 13.1  PLT 237   Thyroid  No results for input(s): "TSH", "FREET4" in the last 168 hours. BNP Recent Labs  Lab 01/06/24 2120  BNP 486.0*    DDimer No results for input(s): "DDIMER" in the last 168 hours.   Radiology/Studies:  No results found.   Assessment and Plan:   Third degree heart block    Risk Assessment/Risk Scores:  {Complete the following score calculators/questions to meet required metrics.  Press F2:1}     New [place]  Heart Association (NYHA) Functional Class NYHA Class II    Code Status: Full Code  Severity of Illness: The appropriate patient status for this patient is INPATIENT. Inpatient status is judged to be reasonable and necessary in order to provide the required intensity of service to ensure the patient's safety. The patient's presenting symptoms, physical exam findings, and initial radiographic and laboratory data in the context of their chronic comorbidities is felt to place them at high risk for further clinical deterioration. Furthermore, it is not anticipated that the patient will be medically stable for discharge from the hospital within 2 midnights of admission.   * I certify that at the point of admission it is my clinical judgment that the patient will require inpatient hospital care spanning beyond 2 midnights from the point of admission due to high intensity of service, high risk for further deterioration and high frequency of surveillance required.*   For questions or updates, please contact [facility] Please consult [email or web address] for contact info under     Signed, Dwane Gitelman, MD  01/06/2024 10:24 PM

## 2024-01-06 NOTE — ED Triage Notes (Signed)
 Pt to ED, sent from urgent care in [place], to have further testing. Pt with hx of CHF, pt c/o bilateral leg swelling that started one week ago. Pt says this has never happened before and she was just told she had CHF today.

## 2024-01-07 ENCOUNTER — Inpatient Hospital Stay (HOSPITAL_COMMUNITY)

## 2024-01-07 ENCOUNTER — Encounter (HOSPITAL_COMMUNITY): Payer: Self-pay | Admitting: Cardiology

## 2024-01-07 ENCOUNTER — Other Ambulatory Visit (HOSPITAL_COMMUNITY): Payer: Self-pay | Admitting: *Deleted

## 2024-01-07 DIAGNOSIS — I442 Atrioventricular block, complete: Secondary | ICD-10-CM | POA: Diagnosis not present

## 2024-01-07 LAB — ECHOCARDIOGRAM COMPLETE
AR max vel: 1.88 cm2
AV Area VTI: 1.81 cm2
AV Area mean vel: 1.74 cm2
AV Mean grad: 5.7 mmHg
AV Peak grad: 12.5 mmHg
Ao pk vel: 1.77 m/s
Area-P 1/2: 5.13 cm2
Height: 64 in
S' Lateral: 2.7 cm
Weight: 2336 [oz_av]

## 2024-01-07 LAB — COMPREHENSIVE METABOLIC PANEL WITH GFR
ALT: 250 U/L — ABNORMAL HIGH (ref 0–44)
AST: 112 U/L — ABNORMAL HIGH (ref 15–41)
Albumin: 3.3 g/dL — ABNORMAL LOW (ref 3.5–5.0)
Alkaline Phosphatase: 73 U/L (ref 38–126)
Anion gap: 7 (ref 5–15)
BUN: 23 mg/dL (ref 8–23)
CO2: 22 mmol/L (ref 22–32)
Calcium: 8.9 mg/dL (ref 8.9–10.3)
Chloride: 114 mmol/L — ABNORMAL HIGH (ref 98–111)
Creatinine, Ser: 0.9 mg/dL (ref 0.44–1.00)
GFR, Estimated: >60 mL/min (ref >60)
Glucose, Bld: 99 mg/dL (ref 70–99)
Potassium: 4.4 mmol/L (ref 3.5–5.1)
Sodium: 143 mmol/L (ref 135–145)
Total Bilirubin: 0.8 mg/dL (ref 0.0–1.2)
Total Protein: 5.8 g/dL — ABNORMAL LOW (ref 6.5–8.1)

## 2024-01-07 LAB — TROPONIN I (HIGH SENSITIVITY)
Troponin I (High Sensitivity): 285 ng/L (ref <18)
Troponin I (High Sensitivity): 335 ng/L (ref <18)

## 2024-01-07 LAB — CBC
HCT: 35.7 % — ABNORMAL LOW (ref 36.0–46.0)
Hemoglobin: 11.5 g/dL — ABNORMAL LOW (ref 12.0–15.0)
MCH: 29.9 pg (ref 26.0–34.0)
MCHC: 32.2 g/dL (ref 30.0–36.0)
MCV: 93 fL (ref 80.0–100.0)
Platelets: 203 10*3/uL (ref 150–400)
RBC: 3.84 MIL/uL — ABNORMAL LOW (ref 3.87–5.11)
RDW: 13.2 % (ref 11.5–15.5)
WBC: 4.7 10*3/uL (ref 4.0–10.5)
nRBC: 0 % (ref 0.0–0.2)

## 2024-01-07 MED ORDER — CIPROFLOXACIN HCL 250 MG PO TABS
250.0000 mg | ORAL_TABLET | Freq: Two times a day (BID) | ORAL | Status: DC
  Administered 2024-01-07 – 2024-01-09 (×4): 250 mg via ORAL
  Filled 2024-01-07 (×6): qty 1

## 2024-01-07 MED ORDER — ENOXAPARIN SODIUM 40 MG/0.4ML IJ SOSY
40.0000 mg | PREFILLED_SYRINGE | INTRAMUSCULAR | Status: DC
  Filled 2024-01-07: qty 0.4

## 2024-01-07 MED ORDER — LATANOPROST 0.005 % OP SOLN
1.0000 [drp] | Freq: Every morning | OPHTHALMIC | Status: DC
  Administered 2024-01-09: 1 [drp] via OPHTHALMIC
  Filled 2024-01-07: qty 2.5

## 2024-01-07 MED ORDER — FUROSEMIDE 10 MG/ML IJ SOLN
40.0000 mg | Freq: Once | INTRAMUSCULAR | Status: AC
  Administered 2024-01-07: 40 mg via INTRAVENOUS
  Filled 2024-01-07: qty 4

## 2024-01-07 NOTE — Progress Notes (Addendum)
 Confirmed with patient that she is taking Cipro  for UTI. Will continue and plan to complete course on 01/10/2024.   Signed,  Metta Actis, PA-C 01/07/2024, 11:00 AM

## 2024-01-07 NOTE — ED Notes (Signed)
[facility] called to transport patient. Nurse aware.

## 2024-01-07 NOTE — Plan of Care (Signed)
  Problem: Education: Goal: Knowledge of cardiac device and self-care will improve Outcome: Not Progressing Goal: Ability to safely manage health related needs after discharge will improve Outcome: Not Progressing   Problem: Cardiac: Goal: Ability to achieve and maintain adequate cardiopulmonary perfusion will improve Outcome: Not Progressing

## 2024-01-07 NOTE — Progress Notes (Signed)
*  PRELIMINARY RESULTS* Echocardiogram 2D Echocardiogram has been performed.  Cassie Gonzalez 01/07/2024, 12:22 PM

## 2024-01-08 ENCOUNTER — Other Ambulatory Visit: Payer: Self-pay

## 2024-01-08 ENCOUNTER — Inpatient Hospital Stay (HOSPITAL_COMMUNITY)
Admission: EM | Disposition: A | Discharge status: Home / Self Care | Payer: Self-pay | Source: Ambulatory Visit | Attending: Cardiology

## 2024-01-08 DIAGNOSIS — I459 Conduction disorder, unspecified: Secondary | ICD-10-CM

## 2024-01-08 HISTORY — PX: PACEMAKER IMPLANT: EP1218

## 2024-01-08 LAB — CBC
HCT: 32.5 % — ABNORMAL LOW (ref 36.0–46.0)
Hemoglobin: 10.9 g/dL — ABNORMAL LOW (ref 12.0–15.0)
MCH: 30.2 pg (ref 26.0–34.0)
MCHC: 33.5 g/dL (ref 30.0–36.0)
MCV: 90 fL (ref 80.0–100.0)
Platelets: 196 10*3/uL (ref 150–400)
RBC: 3.61 MIL/uL — ABNORMAL LOW (ref 3.87–5.11)
RDW: 13.1 % (ref 11.5–15.5)
WBC: 5.7 10*3/uL (ref 4.0–10.5)
nRBC: 0 % (ref 0.0–0.2)

## 2024-01-08 LAB — COMPREHENSIVE METABOLIC PANEL WITH GFR
ALT: 191 U/L — ABNORMAL HIGH (ref 0–44)
AST: 74 U/L — ABNORMAL HIGH (ref 15–41)
Albumin: 2.8 g/dL — ABNORMAL LOW (ref 3.5–5.0)
Alkaline Phosphatase: 51 U/L (ref 38–126)
Anion gap: 7 (ref 5–15)
BUN: 24 mg/dL — ABNORMAL HIGH (ref 8–23)
CO2: 23 mmol/L (ref 22–32)
Calcium: 8.5 mg/dL — ABNORMAL LOW (ref 8.9–10.3)
Chloride: 111 mmol/L (ref 98–111)
Creatinine, Ser: 1.06 mg/dL — ABNORMAL HIGH (ref 0.44–1.00)
GFR, Estimated: 55 mL/min — ABNORMAL LOW (ref >60)
Glucose, Bld: 96 mg/dL (ref 70–99)
Potassium: 3.9 mmol/L (ref 3.5–5.1)
Sodium: 141 mmol/L (ref 135–145)
Total Bilirubin: 0.8 mg/dL (ref 0.0–1.2)
Total Protein: 5 g/dL — ABNORMAL LOW (ref 6.5–8.1)

## 2024-01-08 LAB — SURGICAL PCR SCREEN
MRSA, PCR: NEGATIVE
Staphylococcus aureus: NEGATIVE

## 2024-01-08 SURGERY — PACEMAKER IMPLANT

## 2024-01-08 MED ORDER — POLYETHYLENE GLYCOL 3350 17 G PO PACK
17.0000 g | PACK | Freq: Every day | ORAL | Status: DC
  Administered 2024-01-08 – 2024-01-09 (×2): 17 g via ORAL
  Filled 2024-01-08 (×2): qty 1

## 2024-01-08 MED ORDER — CHLORHEXIDINE GLUCONATE 4 % EX SOLN
60.0000 mL | Freq: Once | CUTANEOUS | Status: AC
  Administered 2024-01-08: 4 via TOPICAL

## 2024-01-08 MED ORDER — FENTANYL CITRATE (PF) 100 MCG/2ML IJ SOLN
INTRAMUSCULAR | Status: DC | PRN
  Administered 2024-01-08: 25 ug via INTRAVENOUS

## 2024-01-08 MED ORDER — ONDANSETRON HCL 4 MG/2ML IJ SOLN: 4.0000 mg | Freq: Four times a day (QID) | INTRAMUSCULAR | Status: DC | PRN

## 2024-01-08 MED ORDER — SODIUM CHLORIDE 0.9 % IV SOLN
INTRAVENOUS | Status: AC
  Administered 2024-01-08: 80 mg
  Filled 2024-01-08: qty 2

## 2024-01-08 MED ORDER — SODIUM CHLORIDE 0.9% FLUSH: 3.0000 mL | Freq: Two times a day (BID) | INTRAVENOUS | Status: DC

## 2024-01-08 MED ORDER — MIDAZOLAM HCL 2 MG/2ML IJ SOLN
INTRAMUSCULAR | Status: AC
  Filled 2024-01-08: qty 2

## 2024-01-08 MED ORDER — SODIUM CHLORIDE 0.9 % IV SOLN
250.0000 mL | INTRAVENOUS | Status: DC
  Administered 2024-01-08: 250 mL via INTRAVENOUS

## 2024-01-08 MED ORDER — LIDOCAINE HCL (PF) 1 % IJ SOLN
INTRAMUSCULAR | Status: AC
  Filled 2024-01-08: qty 60

## 2024-01-08 MED ORDER — VANCOMYCIN HCL IN DEXTROSE 1-5 GM/200ML-% IV SOLN
INTRAVENOUS | Status: AC
  Administered 2024-01-08: 1000 mg via INTRAVENOUS
  Filled 2024-01-08: qty 200

## 2024-01-08 MED ORDER — SIMETHICONE 80 MG PO CHEW
80.0000 mg | CHEWABLE_TABLET | Freq: Four times a day (QID) | ORAL | Status: DC | PRN
  Administered 2024-01-08 (×2): 80 mg via ORAL
  Filled 2024-01-08 (×3): qty 1

## 2024-01-08 MED ORDER — SODIUM CHLORIDE 0.9 % IV SOLN
80.0000 mg | INTRAVENOUS | Status: AC
  Filled 2024-01-08: qty 2

## 2024-01-08 MED ORDER — SODIUM CHLORIDE 0.9% FLUSH: 3.0000 mL | INTRAVENOUS | Status: DC | PRN

## 2024-01-08 MED ORDER — MIDAZOLAM HCL 5 MG/5ML IJ SOLN
INTRAMUSCULAR | Status: DC | PRN
Start: 2024-01-08 — End: 2024-01-08
  Administered 2024-01-08: 1 mg via INTRAVENOUS

## 2024-01-08 MED ORDER — ACETAMINOPHEN 325 MG PO TABS
325.0000 mg | ORAL_TABLET | ORAL | Status: DC | PRN
  Administered 2024-01-08 – 2024-01-09 (×2): 650 mg via ORAL
  Filled 2024-01-08 (×2): qty 2

## 2024-01-08 MED ORDER — LIDOCAINE HCL (PF) 1 % IJ SOLN
INTRAMUSCULAR | Status: DC | PRN
  Administered 2024-01-08: 50 mL

## 2024-01-08 MED ORDER — HEPARIN (PORCINE) IN NACL 1000-0.9 UT/500ML-% IV SOLN
INTRAVENOUS | Status: DC | PRN
  Administered 2024-01-08: 500 mL

## 2024-01-08 MED ORDER — SODIUM CHLORIDE 0.9 % IV SOLN: INTRAVENOUS | Status: DC

## 2024-01-08 MED ORDER — VANCOMYCIN HCL IN DEXTROSE 1-5 GM/200ML-% IV SOLN
1000.0000 mg | INTRAVENOUS | Status: AC
  Filled 2024-01-08: qty 200

## 2024-01-08 MED ORDER — FENTANYL CITRATE (PF) 100 MCG/2ML IJ SOLN
INTRAMUSCULAR | Status: AC
  Filled 2024-01-08: qty 2

## 2024-01-08 SURGICAL SUPPLY — 13 items
CABLE SURGICAL S-101-97-12 (CABLE) ×1 IMPLANT
CATH CPS LOCATOR 3D MED (CATHETERS) IMPLANT
LEAD ULTIPACE 52 LPA1231/52 (Lead) IMPLANT
LEAD ULTIPACE 65 LPA1231/65 (Lead) IMPLANT
PACEMAKER ASSURITY DR-RF (Pacemaker) IMPLANT
PAD DEFIB RADIO PHYSIO CONN (PAD) ×1 IMPLANT
SHEATH 7FR PRELUDE SNAP 13 (SHEATH) IMPLANT
SHEATH 9FR PRELUDE SNAP 13 (SHEATH) IMPLANT
SLITTER AGILIS HISPRO (INSTRUMENTS) IMPLANT
TOOL HELIX LOCKING (MISCELLANEOUS) IMPLANT
TRAY PACEMAKER INSERTION (PACKS) ×1 IMPLANT
WIRE HI TORQ VERSACORE-J 145CM (WIRE) IMPLANT
WIRE MICRO SET SILHO 5FR 7 (SHEATH) IMPLANT

## 2024-01-08 NOTE — Consult Note (Signed)
 ELECTROPHYSIOLOGY CONSULT NOTE    Patient ID: Cassie Gonzalez MRN: [id number], DOB/AGE: 02/08/1951 73 y.o.  Admit date: 01/06/2024 Date of Consult: 01/08/2024  Primary Physician: Ava Lei, DO Primary Cardiologist: Armida Lander, MD  Electrophysiologist: new to Dr. Arlester Ladd   Referring Provider: @ATTENDING @  Patient Profile: Cassie Gonzalez is a 74 y.o. female with a history of HLD, rheumatic fever (as child), GERD, IBS who is being seen today for the evaluation of CHB at the request of Dr. Emmette Harms.  HPI:  Cassie Gonzalez is a 73 y.o. female with PMH as above who presented to [facility] ER 4/20 with increased lower extremity edema and lightheadedness x 1 week.  EKG with CHB. She was hemodynamically stable. In ER, labs notable for BNP up to 480, TSH normal, Cr slightly elevated to 1.11, AST/ALT 145/298.  CXR with mild atelectasis and/or infiltrate and small R pleural effusion.   She was transferred to [facility] for further evaluation and treatment of CHB.  She states her edema has significantly improved since being in hospital. She has also had a cough for the past ~6 weeks with nasal congestion and questions whether these symptoms are related to her low heart rate. She is currently taking cipro  for a UTI and frequently gets yeast infections when on abx, does not currently have s/s of yeast infxn. Currently, She denies chest pain, palpitations, dyspnea, PND, orthopnea, nausea, vomiting, dizziness, syncope, edema, weight gain, or early satiety.   Labs Potassium3.9 (04/23 0342)   Creatinine, ser  1.06* (04/23 0342) PLT  196 (04/23 0342) HGB  10.9* (04/23 0342) WBC 5.7 (04/23 0342) Troponin I (High Sensitivity)335* (04/22 1000).    Past Medical History:  Diagnosis Date   Acid reflux    Anxiety    Arthritis    Eczema    Heart murmur    PMH when she had rheumtic fever   Hypercholesterolemia    Rheumatic fever    as child     Surgical History:  Past Surgical History:  Procedure  Laterality Date   BLADDER REPAIR     CATARACT EXTRACTION     bilateral   CHOLECYSTECTOMY N/A 12/04/2016   Procedure: LAPAROSCOPIC CHOLECYSTECTOMY;  Surgeon: Adalberto Acton, MD;  Location: [facility];  Service: General;  Laterality: N/A;   COLONOSCOPY  03/04/2012   Procedure: COLONOSCOPY;  Surgeon: Beau Bound, MD;  [facility];  Service: Gastroenterology;  Laterality: N/A;   DILATION AND CURETTAGE OF UTERUS     ESOPHAGOGASTRODUODENOSCOPY  03/04/2012   Procedure: ESOPHAGOGASTRODUODENOSCOPY (EGD);  Surgeon: Beau Bound, MD;  Location: [facility];  Service: Gastroenterology;  Laterality: N/A;     Medications Prior to Admission  Medication Sig Dispense Refill Last Dose/Taking   ascorbic acid (VITAMIN C) 500 MG tablet Take by mouth.   Past Week   Calcium-Magnesium-Vitamin D (CITRACAL CALCIUM +D3 PO) Take by mouth.   Past Week   Cholecalciferol (VITAMIN D3) 25 MCG (1000 UT) CAPS 1 capsule.   Past Week   ciprofloxacin  (CIPRO ) 250 MG tablet Take 250 mg by mouth 2 (two) times daily.   01/06/2024 at  7:00 PM   Cyanocobalamin 1000 MCG SUBL Three times a week   Past Week   fexofenadine (ALLEGRA) 180 MG tablet 1 tablet Swallow whole with water ; do not take with fruit juices. Orally Once a day as needed   01/06/2024 Morning   Fexofenadine HCl (MUCINEX ALLERGY  PO) Take 600 mg by mouth 2 (two) times daily.  01/06/2024 Morning   latanoprost  (XALATAN ) 0.005 % ophthalmic solution Place 1 drop into both eyes in the morning.  5 01/06/2024 Morning   nystatin cream (MYCOSTATIN) Apply 1 Application topically 2 (two) times daily.   01/07/2024 Morning   polyethylene glycol powder (GLYCOLAX /MIRALAX ) 17 GM/SCOOP powder Take by mouth.   Past Week   Probiotic Product (ALIGN) 4 MG CAPS Take by mouth.   01/06/2024 Morning   Zinc 50 MG TABS 1 tablet Orally three times a week   Past Week   fluconazole (DIFLUCAN) 150 MG tablet Take 1 tablet by mouth as needed for rash. (Patient not taking: Reported on  03/16/2020)  2 Not Taking   sucralfate  (CARAFATE ) 1 g tablet Take 1 tablet (1 g total) by mouth 4 (four) times daily -  with meals and at bedtime. 120 tablet 0     Inpatient Medications:   ciprofloxacin   250 mg Oral BID   enoxaparin  (LOVENOX ) injection  40 mg Subcutaneous Q24H   latanoprost   1 drop Both Eyes q AM    Allergies:  Allergies  Allergen Reactions   Penicillin G Anaphylaxis   Sulfa Antibiotics Rash   Sulfamethoxazole Rash    Family History  Problem Relation Age of Onset   Diabetes Mother    Heart disease Mother    Neuropathy Mother    Hyperlipidemia Mother    Allergic rhinitis Mother    Prostate cancer Father    Heart disease Sister      Physical Exam: Vitals:   01/07/24 2044 01/07/24 2049 01/08/24 0053 01/08/24 0540  BP:   (!) 125/46 (!) 134/50  Pulse: (!) 37 (!) 38 (!) 36 (!) 49  Resp: (!) 21 15 14 16   Temp:   98.1 F (36.7 C) 97.6 F (36.4 C)  TempSrc:   Oral Oral  SpO2: 98% 99% 97% 96%  Weight:      Height:        GEN- NAD lying in bed, A&O x 3, normal affect HEENT: Normocephalic, atraumatic, wearing glasses Lungs- CTAB, Normal effort.  Heart- Regular, bradycardic rate and rhythm, No M/G/R. JVD at high neck GI- Soft, NT, ND.  Extremities- No clubbing or cyanosis. 1+ bilateral edema   Radiology/Studies: ECHOCARDIOGRAM COMPLETE Result Date: 01/07/2024    ECHOCARDIOGRAM REPORT   Patient Name:   Cassie Gonzalez [facility] Date of Exam: 01/07/2024 Medical Rec #:  [id number]        Height:       64.0 in Accession #:    [id number]       Weight:       146.0 lb Date of Birth:  10/05/1950       BSA:          1.711 m Patient Age:    72 years         BP:           166/48 mmHg Patient Gender: F                HR:           32 bpm. Exam Location:  [facility] Procedure: 2D Echo, Cardiac Doppler and Color Doppler (Both Spectral and Color            Flow Doppler were utilized during procedure). Indications:    Heart block, Complete I44.2  History:        Patient has no prior  history of Echocardiogram examinations.  Risk Factors:Dyslipidemia. Hx of Rheumatic Fever.  Sonographer:    Denese Finn RCS Referring Phys: [id number] Dorma Gash IMPRESSIONS  1. Left ventricular ejection fraction, by estimation, is 55 to 60%. The left ventricle has normal function. The left ventricle has no regional wall motion abnormalities. Left ventricular diastolic parameters are indeterminate.  2. Right ventricular systolic function is normal. The right ventricular size is normal. There is mildly elevated pulmonary artery systolic pressure.  3. The mitral valve is normal in structure. Trivial mitral valve regurgitation. No evidence of mitral stenosis.  4. The tricuspid valve is abnormal.  5. The aortic valve is tricuspid. Aortic valve regurgitation is not visualized. No aortic stenosis is present.  6. The inferior vena cava is dilated in size with <50% respiratory variability, suggesting right atrial pressure of 15 mmHg. FINDINGS  Left Ventricle: Left ventricular ejection fraction, by estimation, is 55 to 60%. The left ventricle has normal function. The left ventricle has no regional wall motion abnormalities. The left ventricular internal cavity size was normal in size. There is  no left ventricular hypertrophy. Left ventricular diastolic parameters are indeterminate. Right Ventricle: The right ventricular size is normal. Right vetricular wall thickness was not well visualized. Right ventricular systolic function is normal. There is mildly elevated pulmonary artery systolic pressure. The tricuspid regurgitant velocity  is 2.66 m/s, and with an assumed right atrial pressure of 15 mmHg, the estimated right ventricular systolic pressure is 43.3 mmHg. Left Atrium: Left atrial size was normal in size. Right Atrium: Right atrial size was normal in size. Pericardium: There is no evidence of pericardial effusion. Mitral Valve: The mitral valve is normal in structure. There is mild thickening of  the mitral valve leaflet(s). There is mild calcification of the mitral valve leaflet(s). Mild mitral annular calcification. Trivial mitral valve regurgitation. No evidence  of mitral valve stenosis. Tricuspid Valve: The tricuspid valve is abnormal. Tricuspid valve regurgitation is mild . No evidence of tricuspid stenosis. Aortic Valve: The aortic valve is tricuspid. Aortic valve regurgitation is not visualized. No aortic stenosis is present. Aortic valve mean gradient measures 5.7 mmHg. Aortic valve peak gradient measures 12.5 mmHg. Aortic valve area, by VTI measures 1.81  cm. Pulmonic Valve: The pulmonic valve was not well visualized. Pulmonic valve regurgitation is not visualized. No evidence of pulmonic stenosis. Aorta: The aortic root is normal in size and structure. Venous: The inferior vena cava is dilated in size with less than 50% respiratory variability, suggesting right atrial pressure of 15 mmHg. IAS/Shunts: No atrial level shunt detected by color flow Doppler.  LEFT VENTRICLE PLAX 2D LVIDd:         5.20 cm   Diastology LVIDs:         2.70 cm   LV e' medial:    6.25 cm/s LV PW:         1.10 cm   LV E/e' medial:  20.1 LV IVS:        0.90 cm   LV e' lateral:   9.73 cm/s LVOT diam:     1.80 cm   LV E/e' lateral: 12.9 LV SV:         77 LV SV Index:   45 LVOT Area:     2.54 cm  RIGHT VENTRICLE RV S prime:     12.05 cm/s TAPSE (M-mode): 2.5 cm LEFT ATRIUM             Index        RIGHT ATRIUM  Index LA diam:        3.70 cm 2.16 cm/m   RA Area:     10.60 cm LA Vol (A2C):   40.8 ml 23.84 ml/m  RA Volume:   22.50 ml  13.15 ml/m LA Vol (A4C):   64.7 ml 37.80 ml/m LA Biplane Vol: 56.4 ml 32.95 ml/m  AORTIC VALVE AV Area (Vmax):    1.88 cm AV Area (Vmean):   1.74 cm AV Area (VTI):     1.81 cm AV Vmax:           176.77 cm/s AV Vmean:          113.781 cm/s AV VTI:            0.425 m AV Peak Grad:      12.5 mmHg AV Mean Grad:      5.7 mmHg LVOT Vmax:         130.50 cm/s LVOT Vmean:        77.750 cm/s  LVOT VTI:          0.302 m LVOT/AV VTI ratio: 0.71  AORTA Ao Root diam: 3.10 cm MITRAL VALVE                TRICUSPID VALVE MV Area (PHT): 5.13 cm     TR Peak grad:   28.3 mmHg MV Decel Time: 148 msec     TR Vmax:        266.00 cm/s MV E velocity: 125.50 cm/s MV A velocity: 104.20 cm/s  SHUNTS MV E/A ratio:  1.20         Systemic VTI:  0.30 m                             Systemic Diam: 1.80 cm Armida Lander MD Electronically signed by Armida Lander MD Signature Date/Time: 01/07/2024/12:46:13 PM    Final    DG Chest 2 View Result Date: 01/07/2024 CLINICAL DATA:  CHF. EXAM: CHEST - 2 VIEW COMPARISON:  June 25, 2022 FINDINGS: The heart size and mediastinal contours are within normal limits. There is mild calcification of the aortic arch. The lungs are hyperinflated. Mild atelectasis and/or infiltrate is seen within the bilateral lung bases. There is a very small right pleural effusion. No pneumothorax is identified. The visualized skeletal structures are unremarkable. IMPRESSION: 1. Mild bibasilar atelectasis and/or infiltrate. 2. Very small right pleural effusion. Electronically Signed   By: Virgle Grime M.D.   On: 01/07/2024 00:51    EKG:01/07/2024 3:1 HB, rate 32; LBBB 01/06/2024 - CHB, rate 34. LBBB  06/25/2022 - SR at 70bpm  (personally reviewed)   TELEMETRY: CHB with periods of 2:1 rates 30-50 (personally reviewed)  DEVICE HISTORY: none  Assessment/Plan: #) CHB #) lower extremity edema #) lightheaded Patient presented to [facility] ER in CHB with increased lower extremity edema and LH x 1 week.  No reversible cause identified No AV nodal blocking agents Normal TSH TTE with normal LVEF  Discussed risks/benefits of PPM with patient, who verbalized understanding and wished to proceed NPO for procedure  MD to see   #) UTI Present on admission, taking 250mg  cipro  BID Afebrile No leukocytosis    For questions or updates, please contact [facility] Please consult [email or web address]  for contact info under Cardiology/STEMI.  Signed, Dnaiel Voller, NP  01/08/2024 8:24 AM

## 2024-01-08 NOTE — Discharge Instructions (Addendum)
 After Your Pacemaker   You have a Abbott Pacemaker  ACTIVITY Do not lift your arm above shoulder height for 1 week after your procedure. After 7 days, you may progress as below.  You should remove your sling 24 hours after your procedure, unless otherwise instructed by your provider.     Wednesday January 15, 2024  Thursday Jan 16, 2024 Friday Jan 17, 2024 Saturday Jan 18, 2024   Do not lift, push, pull, or carry anything over 10 pounds with the affected arm until 6 weeks (Wednesday February 19, 2024 ) after your procedure.   You may drive AFTER your wound check, unless you have been told otherwise by your provider.   Ask your healthcare provider when you can go back to work   INCISION/Dressing If large square, outer bandage is left in place, this can be removed after 24 hours from your procedure. Do not remove steri-strips or glue as below.   If a PRESSURE DRESSING (a bulky dressing that usually goes up over your shoulder) was applied or left in place, please follow instructions given by your provider on when to return to have this removed.   Monitor your Pacemaker site for redness, swelling, and drainage. Call the device clinic at [phone] if you experience these symptoms or fever/chills.  If your incision is sealed with Steri-strips or staples, you may shower 7 days after your procedure or when told by your provider. Do not remove the steri-strips or let the shower hit directly on your site. You may wash around your site with soap and water .    If you were discharged in a sling, please do not wear this during the day more than 48 hours after your surgery unless otherwise instructed. This may increase the risk of stiffness and soreness in your shoulder.   Avoid lotions, ointments, or perfumes over your incision until it is well-healed.  You may use a hot tub or a pool AFTER your wound check appointment if the incision is completely closed.  Pacemaker Alerts:  Some alerts are vibratory  and others beep. These are NOT emergencies. Please call our office to let us  know. If this occurs at night or on weekends, it can wait until the next business day. Send a remote transmission.  If your device is capable of reading fluid status (for heart failure), you will be offered monthly monitoring to review this with you.   DEVICE MANAGEMENT Remote monitoring is used to monitor your pacemaker from home. This monitoring is scheduled every 91 days by our office. It allows us  to keep an eye on the functioning of your device to ensure it is working properly. You will routinely see your Electrophysiologist annually (more often if necessary).   You should receive your ID card for your new device in 4-8 weeks. Keep this card with you at all times once received. Consider wearing a medical alert bracelet or necklace.  Your Pacemaker may be MRI compatible. This will be discussed at your next office visit/wound check.  You should avoid contact with strong electric or magnetic fields.   Do not use amateur (ham) radio equipment or electric (arc) welding torches. MP3 player headphones with magnets should not be used. Some devices are safe to use if held at least 12 inches (30 cm) from your Pacemaker. These include power tools, lawn mowers, and speakers. If you are unsure if something is safe to use, ask your health care provider.  When using your cell phone, hold it to  the ear that is on the opposite side from the Pacemaker. Do not leave your cell phone in a pocket over the Pacemaker.  You may safely use electric blankets, heating pads, computers, and microwave ovens.  Call the office right away if: You have chest pain. You feel more short of breath than you have felt before. You feel more light-headed than you have felt before. Your incision starts to open up.  This information is not intended to replace advice given to you by your health care provider. Make sure you discuss any questions you have with  your health care provider.

## 2024-01-08 NOTE — Discharge Summary (Incomplete)
 ELECTROPHYSIOLOGY PROCEDURE DISCHARGE SUMMARY    Patient ID: Cassie Gonzalez,  MRN: [id number], DOB/AGE: 1951/06/02 73 y.o.  Admit date: 01/06/2024 Discharge date: 01/09/2024  Primary Care Physician: Ava Lei, DO  Primary Cardiologist: Armida Lander, MD  Electrophysiologist: Dr. Arlester Ladd   Primary Discharge Diagnosis:  Complete heart block    Secondary Discharge Diagnosis:  none  Allergies  Allergen Reactions   Penicillin G Anaphylaxis   Sulfa Antibiotics Rash   Sulfamethoxazole Rash     Procedures This Admission:  1.  Implantation of a Abbott Dual Chamber PPM on 01/08/2024 by Dr. Arlester Ladd. The patient received a Abbott Assurity [id number] with a Abbott Ultipace 1231-52 right atrial lead and a Abbott Ultipace 1231-65 right ventricular lead.  There were no immediate post procedure complications.   2.  CXR on  01/09/2024  demonstrated no pneumothorax status post device implantation.       Brief HPI: Cassie Gonzalez is a 73 y.o. female was admitted for high grade block with intermittent CHB and electrophysiology team asked to see for consideration of PPM implantation.  Past medical history includes HLD, rheumatic fever (as child), GERD, IBS.  The patient has had AV block without reversible causes identified.  Risks, benefits, and alternatives to PPM implantation were reviewed with the patient who wished to proceed.   Hospital Course:  The patient was admitted and underwent implantation of a Abbott dual chamber PPM with details as outlined above.  She was monitored on telemetry overnight which demonstrated appropriate pacing.  Left chest was without hematoma or ecchymosis.  The device was interrogated and found to be functioning normally.  CXR was obtained and demonstrated no pneumothorax status post device implantation.  Wound care, arm mobility, and restrictions were reviewed with the patient.  The patient was examined and considered stable for discharge to home.     Anticoagulation resumption This patient is not on anticoagulation        Physical Exam: Vitals:   01/08/24 1943 01/09/24 0049 01/09/24 0428 01/09/24 0735  BP: (!) 133/54 133/61 (!) 141/62 (!) 143/59  Pulse: 76 75 80 86  Resp: 16 15 16 14   Temp: 97.7 F (36.5 C) 98 F (36.7 C) 98 F (36.7 C) 98.3 F (36.8 C)  TempSrc: Oral Oral Oral Oral  SpO2: 96% 92% 93% 96%  Weight:   63.8 kg   Height:        GEN- NAD. A&O x 3.  HEENT: Normocephalic, atraumatic Lungs- CTAB, Normal effort.  Heart- RRR, No M/G/R.  GI- Soft, NT, ND.  Extremities- No clubbing, cyanosis, or edema;  Skin- warm and dry, no rash or lesion, left chest without hematoma/ecchymosis  Discharge Medications:  Allergies as of 01/09/2024       Reactions   Penicillin G Anaphylaxis   Sulfa Antibiotics Rash   Sulfamethoxazole Rash        Medication List     TAKE these medications    Align 4 MG Caps Take by mouth.   ascorbic acid 500 MG tablet Commonly known as: VITAMIN C Take by mouth.   Cipro  250 MG tablet Generic drug: ciprofloxacin  Take 250 mg by mouth 2 (two) times daily.   CITRACAL CALCIUM +D3 PO Take by mouth.   Cyanocobalamin 1000 MCG Subl Three times a week   fexofenadine 180 MG tablet Commonly known as: ALLEGRA 1 tablet Swallow whole with water ; do not take with fruit juices. Orally Once a day as needed   MUCINEX ALLERGY  PO Take 600  mg by mouth 2 (two) times daily.   fluconazole 150 MG tablet Commonly known as: DIFLUCAN Take 1 tablet by mouth as needed for rash.   latanoprost  0.005 % ophthalmic solution Commonly known as: XALATAN  Place 1 drop into both eyes in the morning.   nystatin cream Commonly known as: MYCOSTATIN Apply 1 Application topically 2 (two) times daily.   polyethylene glycol powder 17 GM/SCOOP powder Commonly known as: GLYCOLAX /MIRALAX  Take by mouth.   sucralfate  1 g tablet Commonly known as: Carafate  Take 1 tablet (1 g total) by mouth 4 (four) times  daily -  with meals and at bedtime.   Vitamin D3 25 MCG (1000 UT) Caps 1 capsule.   Zinc 50 MG Tabs 1 tablet Orally three times a week        Disposition:  Home with usual follow up as in AVS   Duration of Discharge Encounter:  APP time: 25 minutes  Signed, Skyah Hannon, NP  01/09/2024 12:11 PM  I have seen and examined this patient with Lyle San.  Agree with above, note added to reflect my findings.  Patient mated to the hospital for complete heart block.  Now post Abbott dual-chamber pacemaker.  Device functioning appropriately.  Chest x-ray without issue.  Will plan for discharge today with follow-up in clinic.  GEN: Well nourished, well developed, in no acute distress  HEENT: normal  Neck: no JVD, carotid bruits, or masses Cardiac: RRR; no murmurs, rubs, or gallops,no edema  Respiratory:  clear to auscultation bilaterally, normal work of breathing GI: soft, nontender, nondistended, + BS MS: no deformity or atrophy  Skin: warm and dry, device site well healed Neuro:  Strength and sensation are intact Psych: euthymic mood, full affect   MD discharge time: 30 minutes  Will M. Camnitz MD 01/09/2024 1:09 PM

## 2024-01-08 NOTE — Plan of Care (Signed)

## 2024-01-08 NOTE — TOC CM/SW Note (Addendum)
 Transition of Care [facility]) - Inpatient Brief Assessment   Patient Details  Name: Cassie Gonzalez MRN: [id number] Date of Birth: [birth year]  Transition of Care [facility]) CM/SW Contact:    Cassie Model, RN Phone Number: 01/08/2024, 3:03 PM   Clinical Narrative: From home alone, has PCP and insurance on file, states has no HH services in place at this time or DME at home.  States son, Cassie Gonzalez or friend will transport them home at [employer] and family is support system, states gets medications from [place] on [address] in [place] .  Pta self ambulatory. Patient gives this NCM permission to speak with her son, Cassie Gonzalez.    Transition of Care Asessment: Insurance and Status: Insurance coverage has been reviewed Patient has primary care physician: Yes Home environment has been reviewed: home alone Prior level of function:: indep Prior/Current Home Services: No current home services Social Drivers of Health Review: SDOH reviewed no interventions necessary Readmission risk has been reviewed: Yes Transition of care needs: no transition of care needs at this time

## 2024-01-09 ENCOUNTER — Inpatient Hospital Stay (HOSPITAL_COMMUNITY)

## 2024-01-09 ENCOUNTER — Other Ambulatory Visit: Payer: Self-pay

## 2024-01-09 ENCOUNTER — Encounter (HOSPITAL_COMMUNITY): Payer: Self-pay | Admitting: Cardiovascular Disease

## 2024-01-09 ENCOUNTER — Telehealth: Payer: Self-pay | Admitting: Orthopedic Surgery

## 2024-01-09 DIAGNOSIS — I442 Atrioventricular block, complete: Secondary | ICD-10-CM | POA: Diagnosis not present

## 2024-01-09 MED ORDER — TRAMADOL HCL 50 MG PO TABS: 50.0000 mg | ORAL_TABLET | Freq: Two times a day (BID) | ORAL | Status: DC | PRN

## 2024-01-09 NOTE — Care Management Important Message (Signed)
 Important Message  Patient Details  Name: CELINDA DETHLEFS MRN: [id number] Date of Birth: [birth year]   Important Message Given:  Yes - Medicare IM     Janith Melnick 01/09/2024, 1:15 PM

## 2024-01-09 NOTE — Plan of Care (Signed)
  Problem: Clinical Measurements: Goal: Diagnostic test results will improve Outcome: Progressing   Problem: Coping: Goal: Level of anxiety will decrease Outcome: Progressing   

## 2024-01-09 NOTE — Telephone Encounter (Signed)
 Dr. Ernesta Heading pt - spoke w/the pt, she stated Dr. Tita Form approved additional PT, but she had to have a pacemaker put in and will not be able to do PT for a few months.  She stated she will call back to continue PT.

## 2024-01-10 ENCOUNTER — Telehealth: Payer: Self-pay | Admitting: Cardiovascular Disease

## 2024-01-10 MED FILL — Midazolam HCl Inj 2 MG/2ML (Base Equivalent): INTRAMUSCULAR | Qty: 1 | Status: AC

## 2024-01-10 NOTE — Telephone Encounter (Signed)
 Pt c/o swelling/edema: STAT if pt has developed SOB within 24 hours  If swelling, where is the swelling located?   Legs, feet and ankles  How much weight have you gained and in what time span?   Yes  Have you gained 2 pounds in a day or 5 pounds in a week?  Not sure  Do you have a log of your daily weights (if so, list)?  No  Are you currently taking a fluid pill?   No  Are you currently SOB?   No  Have you traveled recently in a car or plane for an extended period of time?   No  Patient stated she is still having swelling after her surgery and wants to know if she will need fluid medication.  Patient stated she has been having constipation and wants to know which medication she can take.

## 2024-01-14 ENCOUNTER — Telehealth: Payer: Self-pay | Admitting: Cardiovascular Disease

## 2024-01-14 NOTE — Telephone Encounter (Signed)
 Spoke with pt regarding Ucora. Pt's question was forwarded to our pharmacist for advise. Pt was called with his response and notified that she is ok with take Ucora, however she should follow up with urology at [facility]. Pt verbalized understanding. All questions if any were answered.

## 2024-01-14 NOTE — Telephone Encounter (Signed)
 Pt c/o medication issue:  1. Name of Medication: Ucora  2. How are you currently taking this medication (dosage and times per day)?   3. Are you having a reaction (difficulty breathing--STAT)?   4. What is your medication issue? Pt wants to start taking this medication for UTI's and wants to make sure it is ok

## 2024-01-22 ENCOUNTER — Ambulatory Visit: Attending: Cardiovascular Disease

## 2024-01-22 DIAGNOSIS — I459 Conduction disorder, unspecified: Secondary | ICD-10-CM | POA: Diagnosis present

## 2024-01-22 NOTE — Patient Instructions (Signed)

## 2024-01-23 LAB — CUP PACEART INCLINIC DEVICE CHECK
Battery Remaining Longevity: 68 mo
Battery Voltage: 3.01 V
Brady Statistic RA Percent Paced: 2.1 %
Brady Statistic RV Percent Paced: 99.97 %
Date Time Interrogation Session: 20250507113700
Implantable Lead Connection Status: 753985
Implantable Lead Connection Status: 753985
Implantable Lead Implant Date: 20250423
Implantable Lead Implant Date: 20250423
Implantable Lead Location: 753859
Implantable Lead Location: 753860
Implantable Pulse Generator Implant Date: 20250423
Lead Channel Impedance Value: 437.5 Ohm
Lead Channel Impedance Value: 462.5 Ohm
Lead Channel Pacing Threshold Amplitude: 0.5 V
Lead Channel Pacing Threshold Amplitude: 0.75 V
Lead Channel Pacing Threshold Amplitude: 0.75 V
Lead Channel Pacing Threshold Pulse Width: 0.5 ms
Lead Channel Pacing Threshold Pulse Width: 0.5 ms
Lead Channel Pacing Threshold Pulse Width: 0.5 ms
Lead Channel Sensing Intrinsic Amplitude: 2.1 mV
Lead Channel Sensing Intrinsic Amplitude: 6.1 mV
Lead Channel Setting Pacing Amplitude: 1.5 V
Lead Channel Setting Pacing Amplitude: 3.5 V
Lead Channel Setting Pacing Pulse Width: 0.5 ms
Lead Channel Setting Sensing Sensitivity: 4 mV
Pulse Gen Model: 2272
Pulse Gen Serial Number: 8258209

## 2024-01-23 NOTE — Progress Notes (Signed)
 Normal dual chamber pacemaker wound check. Presenting rhythm: AS/VP. Wound well healed. Routine testing performed. Thresholds, sensing, and impedances consistent with implant measurements and at 3.5V safety margin/auto capture until 3 month visit. No episodes. Reviewed arm restrictions to continue for 6 weeks total post op.  Pt enrolled in remote follow-up.

## 2024-01-28 ENCOUNTER — Telehealth: Payer: Self-pay

## 2024-01-28 NOTE — Telephone Encounter (Signed)
 The pt wanted to know if the muscle around her incision site can be normal? I told her yes. She can take tylenol  for the pain. I told her she can also put an dry cloth over it and put an ice pack on it. No ointments or lotions.

## 2024-01-28 NOTE — Telephone Encounter (Signed)
 Spoke with patient regarding her incision site and issues she called about This RN reinforced what the CMA already told the patient Patient instructed not to put any lotions, creams, ointments, or sunscreen directly on incision  Patient stated her clothes bother the site and asked if she could cover it for a few hours while running errands This RN instructed patient to cover incision with non-adhesive gauze and tape around the edges only and no adhesives directly over any part of incision Patient told to easily remove dressing once finished Patient reminded of lifting restrictions on left arm until 90 day follow up visit Patient reminded of no movement limitations on left arm and encouraged movement to prevent frozen shoulder Patient acknowledged all instructions and education provided by this RN and was grateful for the call Patient encouraged to call clinic with any further questions

## 2024-01-30 ENCOUNTER — Ambulatory Visit: Payer: Self-pay | Admitting: Cardiovascular Disease

## 2024-02-11 ENCOUNTER — Telehealth: Payer: Self-pay | Admitting: Cardiovascular Disease

## 2024-02-11 NOTE — Telephone Encounter (Signed)
 Patient is requesting to know when she would be allowed to apply lotion over where the device was inserted. Patient stated that part of the skin has become very dry. Please advise.

## 2024-02-11 NOTE — Telephone Encounter (Signed)
 Spoke with pt, aware she can not put anything on the site for 91 days after the procedure. She was given the okay to put something on the area where the tape was holding the dressing. Patient voiced understanding to not put anything on the incision site until seen in July and given the okay.

## 2024-02-21 ENCOUNTER — Ambulatory Visit (INDEPENDENT_AMBULATORY_CARE_PROVIDER_SITE_OTHER)

## 2024-02-21 ENCOUNTER — Telehealth: Payer: Self-pay

## 2024-02-21 DIAGNOSIS — I459 Conduction disorder, unspecified: Secondary | ICD-10-CM | POA: Diagnosis not present

## 2024-02-21 NOTE — Telephone Encounter (Signed)
 Pt wanted to know if she can take a UTI medication while having a pacemaker. I told her I will have the nurse call her back.

## 2024-02-21 NOTE — Telephone Encounter (Signed)
 Patient curious about any possible side effects to her pacemaker from taking UQORA or HIPREX medications for UTI prophylaxis. Both drugs are long term and taken on a daily basis.

## 2024-02-26 LAB — CUP PACEART REMOTE DEVICE CHECK
Battery Remaining Longevity: 71 mo
Battery Remaining Percentage: 95.5 %
Battery Voltage: 2.99 V
Brady Statistic AP VP Percent: 4.9 %
Brady Statistic AP VS Percent: 0 %
Brady Statistic AS VP Percent: 95 %
Brady Statistic AS VS Percent: 1 %
Brady Statistic RA Percent Paced: 4.9 %
Brady Statistic RV Percent Paced: 99 %
Date Time Interrogation Session: 20250606040024
Implantable Lead Connection Status: 753985
Implantable Lead Connection Status: 753985
Implantable Lead Implant Date: 20250423
Implantable Lead Implant Date: 20250423
Implantable Lead Location: 753859
Implantable Lead Location: 753860
Implantable Pulse Generator Implant Date: 20250423
Lead Channel Impedance Value: 410 Ohm
Lead Channel Impedance Value: 460 Ohm
Lead Channel Pacing Threshold Amplitude: 0.5 V
Lead Channel Pacing Threshold Amplitude: 0.75 V
Lead Channel Pacing Threshold Pulse Width: 0.5 ms
Lead Channel Pacing Threshold Pulse Width: 0.5 ms
Lead Channel Sensing Intrinsic Amplitude: 12 mV
Lead Channel Sensing Intrinsic Amplitude: 2.2 mV
Lead Channel Setting Pacing Amplitude: 1.5 V
Lead Channel Setting Pacing Amplitude: 3.5 V
Lead Channel Setting Pacing Pulse Width: 0.5 ms
Lead Channel Setting Sensing Sensitivity: 4 mV
Pulse Gen Model: 2272
Pulse Gen Serial Number: 8258209

## 2024-02-28 ENCOUNTER — Ambulatory Visit: Payer: Self-pay | Admitting: Cardiovascular Disease

## 2024-03-04 NOTE — Telephone Encounter (Signed)
 Called patient back to tell them they were safe to take medications based on MD response. Patient appreciative of call back.

## 2024-04-05 NOTE — Progress Notes (Unsigned)
  Electrophysiology Office Note:   Date:  04/09/2024  ID:  Cassie Gonzalez, DOB June 14, 1951, MRN [id number]  Primary Cardiologist: Alvan Carrier, MD Primary Heart Failure: None Electrophysiologist: Eulas FORBES Furbish, MD       History of Present Illness:   Cassie Gonzalez is a 73 y.o. female with h/o high grade AVB with intermittent CHB s/p PPM, HLD, rheumatic fever (as a child), GERD, IBS, UTI's seen today for routine electrophysiology follow-up s/p Pacemaker implant.  Since last being seen in our clinic the patient reports she has been doing largely well. She notes pacemaker site discomfort when she lies on her right side at night, sometimes her clothes will be bothersome over the site. She notes discomfort intermittently that feels like nerve pain. She remains active, does all ADL's and is not limited with activities.     She denies chest pain, palpitations, dyspnea, PND, orthopnea, nausea, vomiting, dizziness, syncope, edema, weight gain, or early satiety.    Review of systems complete and found to be negative unless listed in HPI.   EP Information / Studies Reviewed:    EKG is ordered today. Personal review as below.  EKG Interpretation Date/Time:  Thursday April 09 2024 10:55:19 EDT Ventricular Rate:  72 PR Interval:  222 QRS Duration:  106 QT Interval:  406 QTC Calculation: 444 R Axis:   14  Text Interpretation: Atrial-sensed ventricular-paced rhythm with prolonged AV conduction Confirmed by Aniceto Jarvis ([id number]) on 04/09/2024 11:25:34 AM   PPM Interrogation-  reviewed in detail today,  See PACEART report.  Device History: Abbott Dual Chamber PPM implanted 01/08/24 for CHB  Risk Assessment/Calculations:              Physical Exam:   VS:  BP 120/80   Pulse 72   Ht 5' 4 (1.626 m)   Wt 141 lb 3.2 oz (64 kg)   SpO2 99%   BMI 24.24 kg/m    Wt Readings from Last 3 Encounters:  04/09/24 141 lb 3.2 oz (64 kg)  01/09/24 140 lb 10.5 oz (63.8 kg)  11/26/23 131 lb  (59.4 kg)     GEN: Well nourished, well developed in no acute distress NECK: No JVD; No carotid bruits CARDIAC: Regular rate and rhythm, no murmurs, rubs, gallops. PPM site well healed, no tethering. No erythema or edema.  PPM is prominent due thin chest wall / low muscle mass RESPIRATORY:  Clear to auscultation without rales, wheezing or rhonchi  ABDOMEN: Soft, non-tender, non-distended EXTREMITIES:  No edema; No deformity   ASSESSMENT AND PLAN:    CHB s/p Abbott PPM  PPM Pocket Discomfort  -Normal PPM function -See Pace Art report -CapConfirm testing completed and turned on during device check (battery went from 5.3 to 9.9 years) -EGM reviewed with 4 beat run of AT, lasting seconds -site well healed, no evidence concerning for infection or swelling.  Suspect some degree of irritation due to low chest wall muscle mass and mechanical irritation of pocket.    Disposition:   Follow up with Dr. Furbish in 12 months  Signed, Jarvis Aniceto, NP-C, AGACNP-BC [facility] - Electrophysiology  04/09/2024, 12:25 PM

## 2024-04-07 NOTE — Progress Notes (Signed)
 Remote pacemaker transmission.

## 2024-04-07 NOTE — Addendum Note (Signed)
 Addended by: VICCI SELLER A on: 04/07/2024 11:19 AM   Modules accepted: Orders

## 2024-04-09 ENCOUNTER — Encounter: Payer: Self-pay | Admitting: Pulmonary Disease

## 2024-04-09 ENCOUNTER — Ambulatory Visit: Attending: Pulmonary Disease | Admitting: Pulmonary Disease

## 2024-04-09 VITALS — BP 120/80 | HR 72 | Ht 64.0 in | Wt 141.2 lb

## 2024-04-09 DIAGNOSIS — I459 Conduction disorder, unspecified: Secondary | ICD-10-CM

## 2024-04-09 DIAGNOSIS — Z950 Presence of cardiac pacemaker: Secondary | ICD-10-CM

## 2024-04-09 LAB — CUP PACEART INCLINIC DEVICE CHECK
Battery Remaining Longevity: 115 mo
Battery Voltage: 2.98 V
Brady Statistic RA Percent Paced: 6.9 %
Brady Statistic RV Percent Paced: 99.98 %
Date Time Interrogation Session: 20250724121821
Implantable Lead Connection Status: 753985
Implantable Lead Connection Status: 753985
Implantable Lead Implant Date: 20250423
Implantable Lead Implant Date: 20250423
Implantable Lead Location: 753859
Implantable Lead Location: 753860
Implantable Pulse Generator Implant Date: 20250423
Lead Channel Impedance Value: 425 Ohm
Lead Channel Impedance Value: 475 Ohm
Lead Channel Pacing Threshold Amplitude: 0.375 V
Lead Channel Pacing Threshold Amplitude: 0.875 V
Lead Channel Pacing Threshold Pulse Width: 0.5 ms
Lead Channel Pacing Threshold Pulse Width: 0.5 ms
Lead Channel Sensing Intrinsic Amplitude: 12 mV
Lead Channel Sensing Intrinsic Amplitude: 3.7 mV
Lead Channel Setting Pacing Amplitude: 1.125
Lead Channel Setting Pacing Amplitude: 1.375
Lead Channel Setting Pacing Pulse Width: 0.5 ms
Lead Channel Setting Sensing Sensitivity: 4 mV
Pulse Gen Model: 2272
Pulse Gen Serial Number: 8258209

## 2024-04-09 NOTE — Patient Instructions (Signed)
 Medication Instructions:  Your physician recommends that you continue on your current medications as directed. Please refer to the Current Medication list given to you today.  *If you need a refill on your cardiac medications before your next appointment, please call your pharmacy*  Lab Work: None ordered If you have labs (blood work) drawn today and your tests are completely normal, you will receive your results only by: MyChart Message (if you have MyChart) OR A paper copy in the mail If you have any lab test that is abnormal or we need to change your treatment, we will call you to review the results.  Follow-Up: At [facility], you and your health needs are our priority.  As part of our continuing mission to provide you with exceptional heart care, our providers are all part of one team.  This team includes your primary Cardiologist (physician) and Advanced Practice Providers or APPs (Physician Assistants and Nurse Practitioners) who all work together to provide you with the care you need, when you need it.  Your next appointment:   1 year(s)  Provider:   Eulas Furbish, MD only

## 2024-04-12 ENCOUNTER — Ambulatory Visit: Payer: Self-pay | Admitting: Cardiovascular Disease

## 2024-05-22 ENCOUNTER — Ambulatory Visit (INDEPENDENT_AMBULATORY_CARE_PROVIDER_SITE_OTHER)

## 2024-05-22 ENCOUNTER — Encounter: Admitting: Cardiovascular Disease

## 2024-05-22 DIAGNOSIS — I459 Conduction disorder, unspecified: Secondary | ICD-10-CM

## 2024-05-23 LAB — CUP PACEART REMOTE DEVICE CHECK
Battery Remaining Longevity: 116 mo
Battery Remaining Percentage: 95.5 %
Battery Voltage: 2.99 V
Brady Statistic AP VP Percent: 8.2 %
Brady Statistic AP VS Percent: 0 %
Brady Statistic AS VP Percent: 92 %
Brady Statistic AS VS Percent: 1 %
Brady Statistic RA Percent Paced: 8.1 %
Brady Statistic RV Percent Paced: 99 %
Date Time Interrogation Session: 20250905040015
Implantable Lead Connection Status: 753985
Implantable Lead Connection Status: 753985
Implantable Lead Implant Date: 20250423
Implantable Lead Implant Date: 20250423
Implantable Lead Location: 753859
Implantable Lead Location: 753860
Implantable Pulse Generator Implant Date: 20250423
Lead Channel Impedance Value: 430 Ohm
Lead Channel Impedance Value: 540 Ohm
Lead Channel Pacing Threshold Amplitude: 0.5 V
Lead Channel Pacing Threshold Amplitude: 0.875 V
Lead Channel Pacing Threshold Pulse Width: 0.5 ms
Lead Channel Pacing Threshold Pulse Width: 0.5 ms
Lead Channel Sensing Intrinsic Amplitude: 12 mV
Lead Channel Sensing Intrinsic Amplitude: 2.1 mV
Lead Channel Setting Pacing Amplitude: 1.125
Lead Channel Setting Pacing Amplitude: 1.5 V
Lead Channel Setting Pacing Pulse Width: 0.5 ms
Lead Channel Setting Sensing Sensitivity: 4 mV
Pulse Gen Model: 2272
Pulse Gen Serial Number: 8258209

## 2024-05-30 NOTE — Progress Notes (Signed)
 Remote PPM Transmission

## 2024-06-04 ENCOUNTER — Ambulatory Visit: Payer: Self-pay | Admitting: Cardiovascular Disease

## 2024-06-25 NOTE — Telephone Encounter (Signed)
 SABRA

## 2024-07-08 ENCOUNTER — Telehealth: Payer: Self-pay

## 2024-07-08 NOTE — Telephone Encounter (Signed)
 Pt called in wanting to know if she can be around heavy machinery with her PPM. Pt also wants to know more about CT scans and MRI's

## 2024-07-08 NOTE — Telephone Encounter (Signed)
 Returned call to pt.  She was concerned [place].  Advised that heavy machinery outside of her [place] would not effect her device function.  Advised ok to have CT scans or MRI's.  All questions answered.

## 2024-07-14 NOTE — Telephone Encounter (Signed)
 Pt called in wanting to know if her device was MRI compatible and per Randall it is and pt verbalized understanding

## 2024-08-21 ENCOUNTER — Ambulatory Visit

## 2024-08-21 DIAGNOSIS — I459 Conduction disorder, unspecified: Secondary | ICD-10-CM

## 2024-08-23 LAB — CUP PACEART REMOTE DEVICE CHECK
Battery Remaining Longevity: 115 mo
Battery Remaining Percentage: 94 %
Battery Voltage: 2.99 V
Brady Statistic AP VP Percent: 10 %
Brady Statistic AP VS Percent: 1 %
Brady Statistic AS VP Percent: 90 %
Brady Statistic AS VS Percent: 1 %
Brady Statistic RA Percent Paced: 10 %
Brady Statistic RV Percent Paced: 99 %
Date Time Interrogation Session: 20251205040014
Implantable Lead Connection Status: 753985
Implantable Lead Connection Status: 753985
Implantable Lead Implant Date: 20250423
Implantable Lead Implant Date: 20250423
Implantable Lead Location: 753859
Implantable Lead Location: 753860
Implantable Pulse Generator Implant Date: 20250423
Lead Channel Impedance Value: 430 Ohm
Lead Channel Impedance Value: 560 Ohm
Lead Channel Pacing Threshold Amplitude: 0.5 V
Lead Channel Pacing Threshold Amplitude: 0.75 V
Lead Channel Pacing Threshold Pulse Width: 0.5 ms
Lead Channel Pacing Threshold Pulse Width: 0.5 ms
Lead Channel Sensing Intrinsic Amplitude: 12 mV
Lead Channel Sensing Intrinsic Amplitude: 3.9 mV
Lead Channel Setting Pacing Amplitude: 1 V
Lead Channel Setting Pacing Amplitude: 1.5 V
Lead Channel Setting Pacing Pulse Width: 0.5 ms
Lead Channel Setting Sensing Sensitivity: 4 mV
Pulse Gen Model: 2272
Pulse Gen Serial Number: 8258209

## 2024-08-25 NOTE — Progress Notes (Signed)
 Remote PPM Transmission

## 2024-08-28 ENCOUNTER — Other Ambulatory Visit (HOSPITAL_BASED_OUTPATIENT_CLINIC_OR_DEPARTMENT_OTHER): Payer: Self-pay | Admitting: Orthopedic Surgery

## 2024-08-28 DIAGNOSIS — M542 Cervicalgia: Secondary | ICD-10-CM

## 2024-09-03 ENCOUNTER — Ambulatory Visit: Payer: Self-pay | Admitting: Cardiovascular Disease

## 2024-10-08 NOTE — CV Procedure (Signed)
" °  Device system confirmed to be MRI conditional, with implant date > 6 weeks ago, and no evidence of abandoned or epicardial leads in review of most recent CXR  Device last cleared by EP Provider: Daphne Barrack 10/08/2024  Clearance is good through for 1 year as long as parameters remain stable at time of check. If pt undergoes a cardiac device procedure during that time, they should be re-cleared.   Tachy-therapies to be programmed off if applicable with device back to pre-MRI settings after completion of exam.  Abbott[employer] will be present for programming for the MRI.   Rocky Catalan, RT  10/08/2024 4:11 PM     "

## 2024-10-13 ENCOUNTER — Ambulatory Visit (HOSPITAL_COMMUNITY)
Admission: RE | Admit: 2024-10-13 | Discharge: 2024-10-13 | Disposition: A | Discharge status: Home / Self Care | Source: Ambulatory Visit | Attending: Orthopedic Surgery | Admitting: Orthopedic Surgery

## 2024-11-20 ENCOUNTER — Encounter

## 2024-11-24 ENCOUNTER — Other Ambulatory Visit (HOSPITAL_COMMUNITY)

## 2025-02-19 ENCOUNTER — Encounter

## 2025-05-21 ENCOUNTER — Encounter

## 2025-08-20 ENCOUNTER — Encounter

## 2025-11-19 ENCOUNTER — Encounter
